# Patient Record
Sex: Female | Born: 1935 | Race: White | Hispanic: No | Smoking: Never smoker
Health system: Southern US, Community
[De-identification: ages and names within clinical notes are randomized; demographics above are authoritative.]

## PROBLEM LIST (undated history)

## (undated) DIAGNOSIS — I10 Essential (primary) hypertension: Secondary | ICD-10-CM

## (undated) DIAGNOSIS — Z950 Presence of cardiac pacemaker: Secondary | ICD-10-CM

## (undated) HISTORY — PX: BREAST LUMPECTOMY: SHX2

## (undated) HISTORY — PX: CHOLECYSTECTOMY: SHX55

## (undated) HISTORY — PX: ABDOMINAL HYSTERECTOMY: SHX81

## (undated) HISTORY — PX: APPENDECTOMY: SHX54

---

## 2014-08-14 ENCOUNTER — Emergency Department (HOSPITAL_COMMUNITY): Payer: Medicare Other

## 2014-08-14 ENCOUNTER — Emergency Department (HOSPITAL_COMMUNITY)
Admission: EM | Admit: 2014-08-14 | Discharge: 2014-08-14 | Disposition: A | Discharge status: Home / Self Care | Payer: Medicare Other | Attending: Emergency Medicine | Admitting: Emergency Medicine

## 2014-08-14 ENCOUNTER — Encounter (HOSPITAL_COMMUNITY): Payer: Self-pay

## 2014-08-14 DIAGNOSIS — M25561 Pain in right knee: Secondary | ICD-10-CM | POA: Insufficient documentation

## 2014-08-14 DIAGNOSIS — Z950 Presence of cardiac pacemaker: Secondary | ICD-10-CM | POA: Diagnosis not present

## 2014-08-14 DIAGNOSIS — T1490XA Injury, unspecified, initial encounter: Secondary | ICD-10-CM

## 2014-08-14 DIAGNOSIS — M1711 Unilateral primary osteoarthritis, right knee: Secondary | ICD-10-CM

## 2014-08-14 DIAGNOSIS — I10 Essential (primary) hypertension: Secondary | ICD-10-CM | POA: Diagnosis not present

## 2014-08-14 HISTORY — DX: Essential (primary) hypertension: I10

## 2014-08-14 HISTORY — DX: Presence of cardiac pacemaker: Z95.0

## 2014-08-14 MED ORDER — TRAMADOL HCL 50 MG PO TABS: 50.0000 mg | ORAL_TABLET | Freq: Four times a day (QID) | ORAL | Status: DC | PRN

## 2014-08-14 NOTE — ED Notes (Signed)
Pt fell 3 months ago and face planted.  Has had trouble with knee since then.  Sciatic pain, knee pain and has been trying meds.  Pt felt click in rt knee yesterday.  Pain increased and difficulty ambulating.

## 2014-08-14 NOTE — Discharge Instructions (Signed)
Wear your immobilizer as needed. Follow up with the orthopedic physician.  Tramadol tablets What is this medicine? TRAMADOL (TRA ma dole) is a pain reliever. It is used to treat moderate to severe pain in adults. This medicine may be used for other purposes; ask your health care provider or pharmacist if you have questions. COMMON BRAND NAME(S): Ultram What should I tell my health care provider before I take this medicine? They need to know if you have any of these conditions: -brain tumor -depression -drug abuse or addiction -head injury -if you frequently drink alcohol containing drinks -kidney disease or trouble passing urine -liver disease -lung disease, asthma, or breathing problems -seizures or epilepsy -suicidal thoughts, plans, or attempt; a previous suicide attempt by you or a family member -an unusual or allergic reaction to tramadol, codeine, other medicines, foods, dyes, or preservatives -pregnant or trying to get pregnant -breast-feeding How should I use this medicine? Take this medicine by mouth with a full glass of water. Follow the directions on the prescription label. If the medicine upsets your stomach, take it with food or milk. Do not take more medicine than you are told to take. Talk to your pediatrician regarding the use of this medicine in children. Special care may be needed. Overdosage: If you think you have taken too much of this medicine contact a poison control center or emergency room at once. NOTE: This medicine is only for you. Do not share this medicine with others. What if I miss a dose? If you miss a dose, take it as soon as you can. If it is almost time for your next dose, take only that dose. Do not take double or extra doses. What may interact with this medicine? Do not take this medicine with any of the following medications: -MAOIs like Carbex, Eldepryl, Marplan, Nardil, and Parnate This medicine may also interact with the following  medications: -alcohol or medicines that contain alcohol -antihistamines -benzodiazepines -bupropion -carbamazepine or oxcarbazepine -clozapine -cyclobenzaprine -digoxin -furazolidone -linezolid -medicines for depression, anxiety, or psychotic disturbances -medicines for migraine headache like almotriptan, eletriptan, frovatriptan, naratriptan, rizatriptan, sumatriptan, zolmitriptan -medicines for pain like pentazocine, buprenorphine, butorphanol, meperidine, nalbuphine, and propoxyphene -medicines for sleep -muscle relaxants -naltrexone -phenobarbital -phenothiazines like perphenazine, thioridazine, chlorpromazine, mesoridazine, fluphenazine, prochlorperazine, promazine, and trifluoperazine -procarbazine -warfarin This list may not describe all possible interactions. Give your health care provider a list of all the medicines, herbs, non-prescription drugs, or dietary supplements you use. Also tell them if you smoke, drink alcohol, or use illegal drugs. Some items may interact with your medicine. What should I watch for while using this medicine? Tell your doctor or health care professional if your pain does not go away, if it gets worse, or if you have new or a different type of pain. You may develop tolerance to the medicine. Tolerance means that you will need a higher dose of the medicine for pain relief. Tolerance is normal and is expected if you take this medicine for a long time. Do not suddenly stop taking your medicine because you may develop a severe reaction. Your body becomes used to the medicine. This does NOT mean you are addicted. Addiction is a behavior related to getting and using a drug for a non-medical reason. If you have pain, you have a medical reason to take pain medicine. Your doctor will tell you how much medicine to take. If your doctor wants you to stop the medicine, the dose will be slowly lowered over time to avoid any  side effects. You may get drowsy or dizzy. Do  not drive, use machinery, or do anything that needs mental alertness until you know how this medicine affects you. Do not stand or sit up quickly, especially if you are an older patient. This reduces the risk of dizzy or fainting spells. Alcohol can increase or decrease the effects of this medicine. Avoid alcoholic drinks. You may have constipation. Try to have a bowel movement at least every 2 to 3 days. If you do not have a bowel movement for 3 days, call your doctor or health care professional. Your mouth may get dry. Chewing sugarless gum or sucking hard candy, and drinking plenty of water may help. Contact your doctor if the problem does not go away or is severe. What side effects may I notice from receiving this medicine? Side effects that you should report to your doctor or health care professional as soon as possible: -allergic reactions like skin rash, itching or hives, swelling of the face, lips, or tongue -breathing difficulties, wheezing -confusion -itching -light headedness or fainting spells -redness, blistering, peeling or loosening of the skin, including inside the mouth -seizures Side effects that usually do not require medical attention (report to your doctor or health care professional if they continue or are bothersome): -constipation -dizziness -drowsiness -headache -nausea, vomiting This list may not describe all possible side effects. Call your doctor for medical advice about side effects. You may report side effects to FDA at 1-800-FDA-1088. Where should I keep my medicine? Keep out of the reach of children. Store at room temperature between 15 and 30 degrees C (59 and 86 degrees F). Keep container tightly closed. Throw away any unused medicine after the expiration date. NOTE: This sheet is a summary. It may not cover all possible information. If you have questions about this medicine, talk to your doctor, pharmacist, or health care provider.  2015, [employer]. (2010-05-18 11:55:44)

## 2014-08-14 NOTE — ED Provider Notes (Addendum)
CSN: [id number]     Arrival date & time 08/14/14  1032 History   First MD Initiated Contact with Patient 08/14/14 1228     Chief Complaint  Patient presents with  . Fall  . Knee Pain     (Consider location/radiation/quality/duration/timing/severity/associated sxs/prior Treatment) Patient is a 80 y.o. female presenting with fall and knee pain. The history is provided by the patient.  Fall  Knee Pain She had fallen about 3 months ago and injured her right knee. She saw her orthopedic doctor in [place] who x-rayed her knee and told her she had arthritis. The knee had been improving until last night when she felt something pop in the knee and she has had severe pain in the knee since then. Pain is 0/10 if she is at rest but goes to 8/10 if she tries to move it or stand on it. She denies pain in her hip or back. She has been ambulating with a cane.  Past Medical History  Diagnosis Date  . Pacemaker   . Hypertension    Past Surgical History  Procedure Laterality Date  . Cholecystectomy    . Appendectomy    . Breast lumpectomy    . Abdominal hysterectomy     History reviewed. No pertinent family history. History  Substance Use Topics  . Smoking status: Never Smoker   . Smokeless tobacco: Not on file  . Alcohol Use: No   OB History    No data available     Review of Systems  All other systems reviewed and are negative.     Allergies  Sulfa antibiotics  Home Medications   Prior to Admission medications   Not on File   BP 115/46 mmHg  Pulse 81  Temp(Src) 97.5 F (36.4 C) (Oral)  Resp 18  SpO2 97% Physical Exam  Nursing note and vitals reviewed.  80 year old female, resting comfortably and in no acute distress. Vital signs are normal. Oxygen saturation is 97%, which is normal. Head is normocephalic and atraumatic. PERRLA, EOMI. Oropharynx is clear. Neck is nontender and supple without adenopathy or JVD. Back is nontender and there is no CVA tenderness. Lungs  are clear without rales, wheezes, or rhonchi. Chest is nontender. Heart has regular rate and rhythm without murmur. Abdomen is soft, flat, nontender without masses or hepatosplenomegaly and peristalsis is normoactive. Extremities: Right knee is moderately swollen with a small effusion present. There is pain with passive flexion of the knee. There is no tenderness to palpation the right hip and there is full range of motion of the right hip without any discomfort whatsoever. 1+ pitting edema is present bilaterally. Skin is warm and dry without rash. Neurologic: Mental status is normal, cranial nerves are intact, there are no motor or sensory deficits.  ED Course  Procedures (including critical care time)  Imaging Review Dg Knee Complete 4 Views Right  08/14/2014   CLINICAL DATA:  Fall 3 months ago with pain in the knee.  EXAM: RIGHT KNEE - COMPLETE 4+ VIEW  COMPARISON:  None.  FINDINGS: The knee is located. There is moderate joint space narrowing of the medial compartment. Osteophytes are seen in the lateral and patellofemoral compartments. Lateral compartment joint space is preserved.  On two views, a probable 7 mm intra-articular loose body is seen.  No acute fracture is seen.  Negative for joint effusion.  IMPRESSION: 1. No definite acute bony abnormality. 2. Probable 7 mm loose body in the deep joint. 3. 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   Electronically Signed   By: Curlene Dolphin M.D.   On: 08/14/2014 12:13   Images viewed by me.  MDM   Final diagnoses:  Pain in right knee  Osteoarthritis of right knee, unspecified osteoarthritis type    Right knee pain of uncertain cause. X-ray shows no evidence of fracture. She may well have a meniscus injury. Patient and family are requesting referral to an orthopedist in [facility] referred to on-call orthopedics per G is given a knee immobilizer for comfort and a prescription is given for  tramadol for pain. Of note, she does have a pacemaker which is programmed to treat tachycardia and this will probably prevent her from having MRI scan done. She is also anticoagulated on rivaroxaban.    Delora Fuel, MD 30/07/62 2633  Cordero Surette, MD 35/45/62 [id number]

## 2014-10-23 DIAGNOSIS — E119 Type 2 diabetes mellitus without complications: Secondary | ICD-10-CM | POA: Diagnosis not present

## 2014-10-23 DIAGNOSIS — I10 Essential (primary) hypertension: Secondary | ICD-10-CM | POA: Diagnosis not present

## 2014-10-26 DIAGNOSIS — R7301 Impaired fasting glucose: Secondary | ICD-10-CM | POA: Diagnosis not present

## 2014-10-26 DIAGNOSIS — I10 Essential (primary) hypertension: Secondary | ICD-10-CM | POA: Diagnosis not present

## 2014-10-26 DIAGNOSIS — G4700 Insomnia, unspecified: Secondary | ICD-10-CM | POA: Diagnosis not present

## 2014-11-16 DIAGNOSIS — G4700 Insomnia, unspecified: Secondary | ICD-10-CM | POA: Diagnosis not present

## 2014-11-16 DIAGNOSIS — I10 Essential (primary) hypertension: Secondary | ICD-10-CM | POA: Diagnosis not present

## 2014-11-24 DIAGNOSIS — I10 Essential (primary) hypertension: Secondary | ICD-10-CM | POA: Diagnosis not present

## 2014-11-24 DIAGNOSIS — I4891 Unspecified atrial fibrillation: Secondary | ICD-10-CM | POA: Diagnosis not present

## 2014-11-24 DIAGNOSIS — I442 Atrioventricular block, complete: Secondary | ICD-10-CM | POA: Diagnosis not present

## 2014-11-30 DIAGNOSIS — G4700 Insomnia, unspecified: Secondary | ICD-10-CM | POA: Diagnosis not present

## 2014-11-30 DIAGNOSIS — I10 Essential (primary) hypertension: Secondary | ICD-10-CM | POA: Diagnosis not present

## 2014-12-16 DIAGNOSIS — M25561 Pain in right knee: Secondary | ICD-10-CM | POA: Diagnosis not present

## 2015-02-15 IMAGING — CR DG KNEE COMPLETE 4+V*R*
4 series · 4 of 4 positions shown · non-contrast
Comparison: None.

CLINICAL DATA: Fall 3 months ago with pain in the knee.

EXAM:
RIGHT KNEE - COMPLETE 4+ VIEW

[t knee ap right]
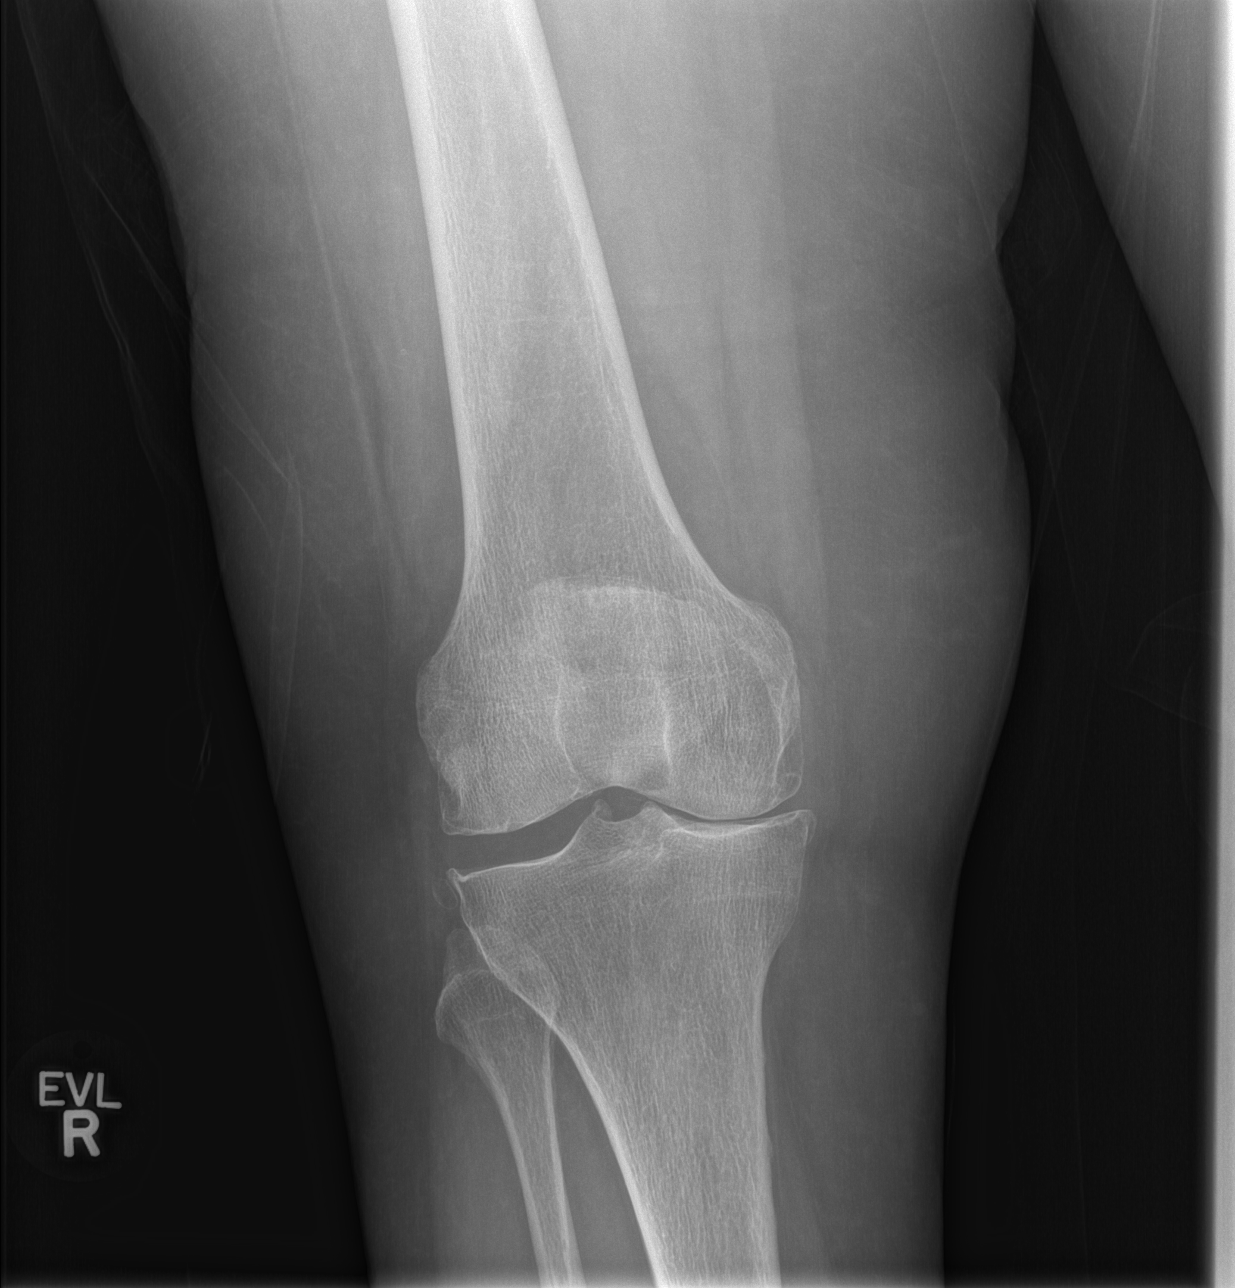

[t knee obl right (1 of 2)]
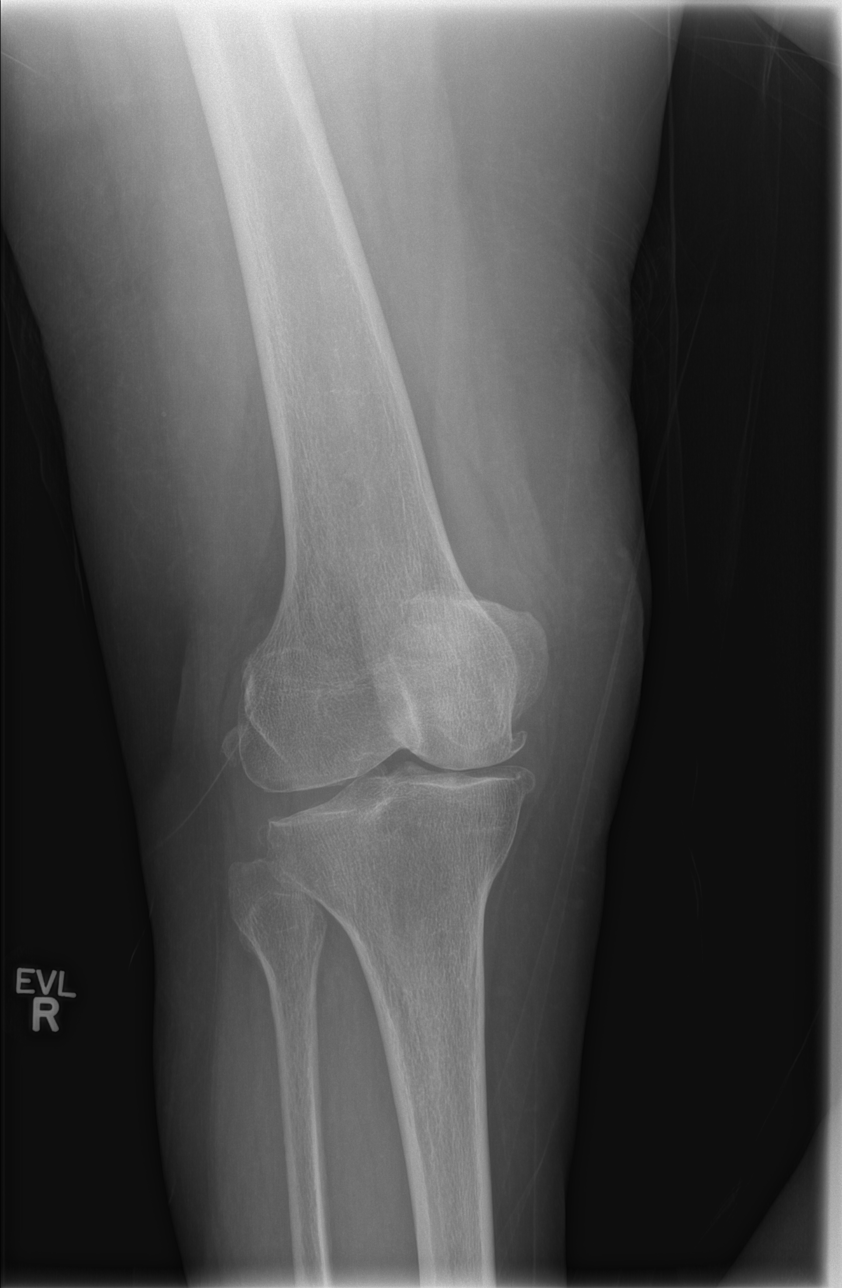

[t knee obl right (2 of 2)]
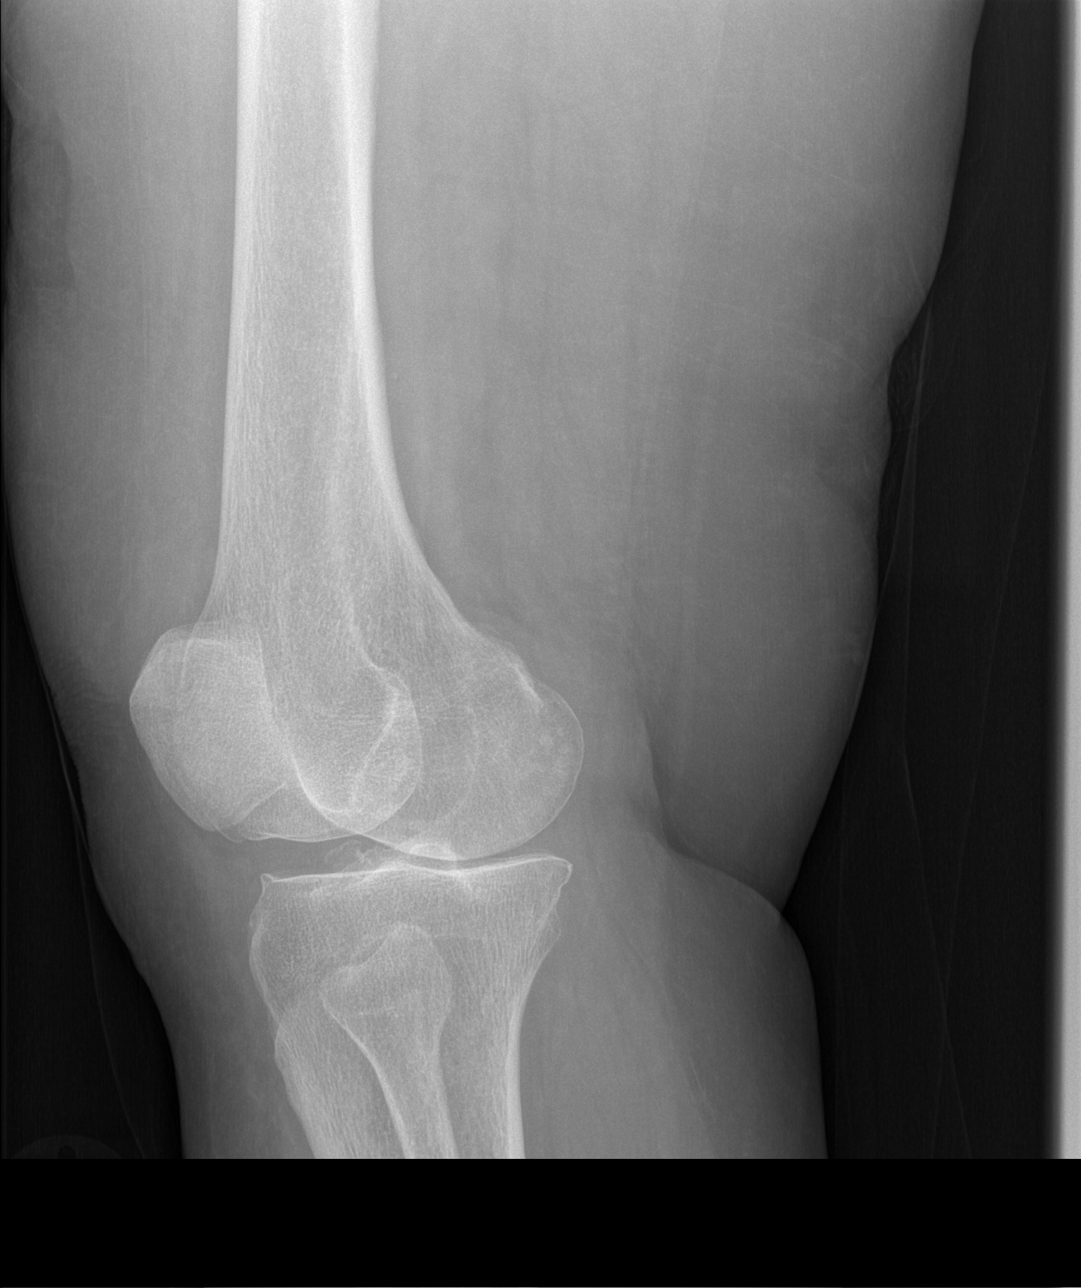

[t knee lat right]
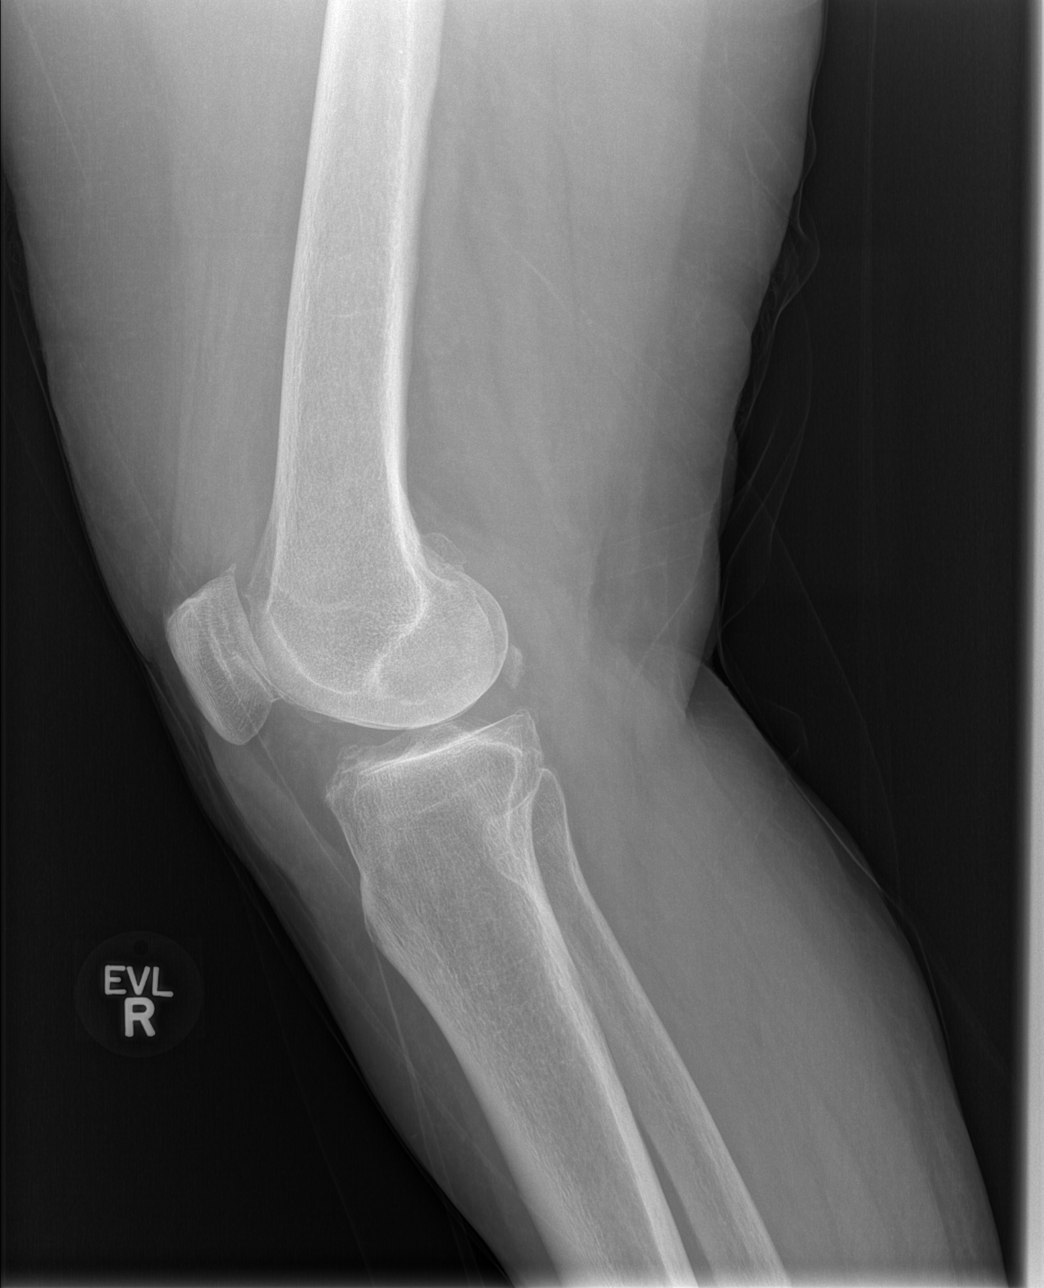

[4 of 4 positions shown; findings below may reference images not displayed]

FINDINGS: The knee is located. There is moderate joint space narrowing of the
medial compartment. Osteophytes are seen in the lateral and
patellofemoral compartments. Lateral compartment joint space is
preserved.

On two views, a probable 7 mm intra-articular loose body is seen.

No acute fracture is seen.

Negative for joint effusion.
IMPRESSION: 1. No definite acute bony abnormality.
2. Probable 7 mm loose body in the deep joint.
3. Tricompartmental osteoarthritis, most significant in the medial
compartment.

## 2015-02-26 DIAGNOSIS — I442 Atrioventricular block, complete: Secondary | ICD-10-CM | POA: Diagnosis not present

## 2015-02-26 DIAGNOSIS — I4891 Unspecified atrial fibrillation: Secondary | ICD-10-CM | POA: Diagnosis not present

## 2015-04-16 DIAGNOSIS — E119 Type 2 diabetes mellitus without complications: Secondary | ICD-10-CM | POA: Diagnosis not present

## 2015-04-16 DIAGNOSIS — E559 Vitamin D deficiency, unspecified: Secondary | ICD-10-CM | POA: Diagnosis not present

## 2015-04-16 DIAGNOSIS — E780 Pure hypercholesterolemia: Secondary | ICD-10-CM | POA: Diagnosis not present

## 2015-04-16 DIAGNOSIS — E039 Hypothyroidism, unspecified: Secondary | ICD-10-CM | POA: Diagnosis not present

## 2015-04-16 DIAGNOSIS — I10 Essential (primary) hypertension: Secondary | ICD-10-CM | POA: Diagnosis not present

## 2015-04-26 DIAGNOSIS — Z0000 Encounter for general adult medical examination without abnormal findings: Secondary | ICD-10-CM | POA: Diagnosis not present

## 2015-04-26 DIAGNOSIS — I4891 Unspecified atrial fibrillation: Secondary | ICD-10-CM | POA: Diagnosis not present

## 2015-04-26 DIAGNOSIS — Z23 Encounter for immunization: Secondary | ICD-10-CM | POA: Diagnosis not present

## 2015-04-26 DIAGNOSIS — I10 Essential (primary) hypertension: Secondary | ICD-10-CM | POA: Diagnosis not present

## 2015-04-26 DIAGNOSIS — E119 Type 2 diabetes mellitus without complications: Secondary | ICD-10-CM | POA: Diagnosis not present

## 2015-04-26 DIAGNOSIS — G4700 Insomnia, unspecified: Secondary | ICD-10-CM | POA: Diagnosis not present

## 2015-06-08 DIAGNOSIS — I442 Atrioventricular block, complete: Secondary | ICD-10-CM | POA: Diagnosis not present

## 2015-06-08 DIAGNOSIS — I495 Sick sinus syndrome: Secondary | ICD-10-CM | POA: Diagnosis not present

## 2015-06-08 DIAGNOSIS — I4891 Unspecified atrial fibrillation: Secondary | ICD-10-CM | POA: Diagnosis not present

## 2015-07-14 DIAGNOSIS — H47323 Drusen of optic disc, bilateral: Secondary | ICD-10-CM | POA: Diagnosis not present

## 2015-07-22 DIAGNOSIS — L219 Seborrheic dermatitis, unspecified: Secondary | ICD-10-CM | POA: Diagnosis not present

## 2015-07-22 DIAGNOSIS — L821 Other seborrheic keratosis: Secondary | ICD-10-CM | POA: Diagnosis not present

## 2015-07-22 DIAGNOSIS — I781 Nevus, non-neoplastic: Secondary | ICD-10-CM | POA: Diagnosis not present

## 2015-08-05 DIAGNOSIS — Z23 Encounter for immunization: Secondary | ICD-10-CM | POA: Diagnosis not present

## 2015-09-09 DIAGNOSIS — I495 Sick sinus syndrome: Secondary | ICD-10-CM | POA: Diagnosis not present

## 2015-09-09 DIAGNOSIS — I442 Atrioventricular block, complete: Secondary | ICD-10-CM | POA: Diagnosis not present

## 2015-09-09 DIAGNOSIS — I4891 Unspecified atrial fibrillation: Secondary | ICD-10-CM | POA: Diagnosis not present

## 2015-10-29 DIAGNOSIS — E119 Type 2 diabetes mellitus without complications: Secondary | ICD-10-CM | POA: Diagnosis not present

## 2015-10-29 DIAGNOSIS — Z136 Encounter for screening for cardiovascular disorders: Secondary | ICD-10-CM | POA: Diagnosis not present

## 2015-10-29 DIAGNOSIS — I10 Essential (primary) hypertension: Secondary | ICD-10-CM | POA: Diagnosis not present

## 2015-11-01 DIAGNOSIS — R202 Paresthesia of skin: Secondary | ICD-10-CM | POA: Diagnosis not present

## 2015-11-01 DIAGNOSIS — G4700 Insomnia, unspecified: Secondary | ICD-10-CM | POA: Diagnosis not present

## 2015-11-01 DIAGNOSIS — E119 Type 2 diabetes mellitus without complications: Secondary | ICD-10-CM | POA: Diagnosis not present

## 2015-11-01 DIAGNOSIS — I10 Essential (primary) hypertension: Secondary | ICD-10-CM | POA: Diagnosis not present

## 2015-12-14 DIAGNOSIS — I442 Atrioventricular block, complete: Secondary | ICD-10-CM | POA: Diagnosis not present

## 2015-12-14 DIAGNOSIS — I495 Sick sinus syndrome: Secondary | ICD-10-CM | POA: Diagnosis not present

## 2015-12-14 DIAGNOSIS — I4891 Unspecified atrial fibrillation: Secondary | ICD-10-CM | POA: Diagnosis not present

## 2015-12-27 DIAGNOSIS — R55 Syncope and collapse: Secondary | ICD-10-CM | POA: Diagnosis not present

## 2016-02-01 DIAGNOSIS — M1711 Unilateral primary osteoarthritis, right knee: Secondary | ICD-10-CM | POA: Diagnosis not present

## 2016-03-22 DIAGNOSIS — I495 Sick sinus syndrome: Secondary | ICD-10-CM | POA: Diagnosis not present

## 2016-03-22 DIAGNOSIS — I442 Atrioventricular block, complete: Secondary | ICD-10-CM | POA: Diagnosis not present

## 2016-03-22 DIAGNOSIS — I4891 Unspecified atrial fibrillation: Secondary | ICD-10-CM | POA: Diagnosis not present

## 2016-04-26 ENCOUNTER — Telehealth: Payer: Self-pay | Admitting: Cardiology

## 2016-05-01 ENCOUNTER — Telehealth: Payer: Self-pay | Admitting: Cardiology

## 2016-05-01 NOTE — Telephone Encounter (Signed)
Records rec from [place] in chart prep room.

## 2016-05-02 ENCOUNTER — Encounter: Payer: Self-pay | Admitting: Cardiology

## 2016-05-03 DIAGNOSIS — Z950 Presence of cardiac pacemaker: Secondary | ICD-10-CM | POA: Insufficient documentation

## 2016-05-03 DIAGNOSIS — I10 Essential (primary) hypertension: Secondary | ICD-10-CM | POA: Insufficient documentation

## 2016-05-04 ENCOUNTER — Encounter (INDEPENDENT_AMBULATORY_CARE_PROVIDER_SITE_OTHER): Payer: Self-pay

## 2016-05-04 ENCOUNTER — Encounter: Payer: Self-pay | Admitting: Cardiology

## 2016-05-04 ENCOUNTER — Ambulatory Visit (INDEPENDENT_AMBULATORY_CARE_PROVIDER_SITE_OTHER): Payer: Medicare Other | Admitting: Cardiology

## 2016-05-04 VITALS — BP 144/86 | HR 78 | Ht 63.0 in | Wt 173.8 lb

## 2016-05-04 DIAGNOSIS — I442 Atrioventricular block, complete: Secondary | ICD-10-CM

## 2016-05-04 DIAGNOSIS — I482 Chronic atrial fibrillation: Secondary | ICD-10-CM

## 2016-05-04 DIAGNOSIS — I4821 Permanent atrial fibrillation: Secondary | ICD-10-CM

## 2016-05-04 LAB — CUP PACEART INCLINIC DEVICE CHECK
Date Time Interrogation Session: 20170817170608
Lead Channel Impedance Value: 312 Ohm
Lead Channel Impedance Value: 507 Ohm
Lead Channel Sensing Intrinsic Amplitude: 2.5 mV
Lead Channel Setting Pacing Pulse Width: 0.4 ms
Lead Channel Setting Sensing Sensitivity: 2.5 mV
MDC IDC MSMT LEADCHNL RV PACING THRESHOLD AMPLITUDE: 0.6 V
MDC IDC MSMT LEADCHNL RV PACING THRESHOLD PULSEWIDTH: 0.4 ms
MDC IDC SET LEADCHNL RV PACING AMPLITUDE: 2.5 V
Pulse Gen Serial Number: 66295010

## 2016-05-04 NOTE — Progress Notes (Signed)
Electrophysiology Office Note   Date:  05/07/2016   ID:  Penny Sanders, DOB 1927/03/23, MRN [id number]  PCP:  Pcp Not In System  Primary Electrophysiologist:  Penny Vezina Meredith Leeds, MD    Chief Complaint  Patient presents with  . New Patient (Initial Visit)  . Dizziness     History of Present Illness: Penny Sanders is a 82 y.o. female who presents today for electrophysiology evaluation.    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 She has persistent atrial fibrillation and has been programmed to VVIR. She currently takes rivaroxaban for her atrial fibrillation.  She had an echo done in 2013 that showed an EF greater than 65% and mild mitral regurgitation.    Today, she denies symptoms of palpitations, chest pain, shortness of breath, orthopnea, PND, lower extremity edema, claudication, presyncope, bleeding, or neurologic sequela. The patient is tolerating medications without difficulties. She says that she has been having episodes of dizziness when changing positions.  She was previously told by her PA to change positions slowly as she likely is orthostatic.  She states that his has helped quite a bit with her episodes of dizziness.  She also states that she has been taking her Xarelto every other day.  She is doing this as she is scared that she Penny Sanders have life-threatening bleeding.    Past Medical History:  Diagnosis Date  . Hypertension   . Pacemaker    Past Surgical History:  Procedure Laterality Date  . ABDOMINAL HYSTERECTOMY    . APPENDECTOMY    . BREAST LUMPECTOMY    . CHOLECYSTECTOMY       Current Outpatient Prescriptions  Medication Sig Dispense Refill  . acetaminophen (TYLENOL) 500 MG tablet Take 500 mg by mouth every 6 (six) hours as needed for mild pain.    Marland Kitchen amLODipine-benazepril (LOTREL) 5-10 MG per capsule Take 1 capsule by mouth daily.   0  . CALCIUM PO Take 1 tablet by mouth  daily.    . cholecalciferol (VITAMIN D) 1000 UNITS tablet Take 1,000 Units by mouth 2 (two) times daily.    . hydroxypropyl methylcellulose / hypromellose (ISOPTO TEARS / GONIOVISC) 2.5 % ophthalmic solution Place 1 drop into both eyes as needed for dry eyes.    . multivitamin-lutein (OCUVITE-LUTEIN) CAPS capsule Take 1 capsule by mouth daily.    . potassium chloride (K-DUR,KLOR-CON) 10 MEQ tablet Take 10 mEq by mouth 2 (two) times daily.   0  . sodium chloride (OCEAN) 0.65 % SOLN nasal spray Place 1 spray into both nostrils as needed for congestion.    Alveda Reasons 20 MG TABS tablet Take 20 mg by mouth daily with supper.   0   No current facility-administered medications for this visit.     Allergies:   Sulfa antibiotics   Social History:  The patient  reports that she has never smoked. She has never used smokeless tobacco. She reports that she does not drink alcohol.   Family History:  [name]   family history includes Anemia in her mother; Bladder Cancer in her father; Breast cancer in her sister; Cancer in her brother; Hypertension in her mother.    ROS:  Please see the history of present illness.   Otherwise, review of systems is positive for dizziness, passing out.   All other systems are reviewed and negative.    PHYSICAL EXAM: VS:  BP (!) 144/86   Pulse 78  Ht 5\' 3"  (1.6 m)   Wt 173 lb 12.8 oz (78.8 kg)   BMI 30.79 kg/m  , BMI Body mass index is 30.79 kg/m. GEN: Well nourished, well developed, in no acute distress  HEENT: normal  Neck: no JVD, carotid bruits, or masses Cardiac: RRR; no murmurs, rubs, or gallops,no edema  Respiratory:  clear to auscultation bilaterally, normal work of breathing GI: soft, nontender, nondistended, + BS MS: no deformity or atrophy  Skin: warm and dry,  device pocket is well healed Neuro:  Strength and sensation are intact Psych: euthymic mood, full affect  EKG:  EKG is ordered today. The ekg ordered today shows atrial fibrillation, V  paced   Device interrogation is reviewed by me today in detail.  See PaceArt for details.   Recent Labs: No results found for requested labs within last 8760 hours.    Lipid Panel  No results found for: CHOL, TRIG, HDL, CHOLHDL, VLDL, LDLCALC, LDLDIRECT   Wt Readings from Last 3 Encounters:  05/04/16 173 lb 12.8 oz (78.8 kg)      Other studies Reviewed: Additional studies/ records that were reviewed today include: cardiology notes   ASSESSMENT AND PLAN:  1.  Persistent atrial fibrillation:  Is in complete heart block with her atrial fibrillation. We did discuss her anticoagulation. She takes, currently, rivaroxaban. She does take this every other day as she is worried about bleeding complications. We discussed her risk of stroke being 4.8% per yea daily and the importance of anticoagulation. I told her that her risk of stroke is likely higher than her risk of significant bleeding as she has had normal colonoscopies in the past. She has agreed to take her anticoagulation  This patients CHA2DS2-VASc Score and unadjusted Ischemic Stroke Rate (% per year) is equal to 4.8 % stroke rate/year from a score of 4  Above score calculated as 1 point each if present [CHF, HTN, DM, Vascular=MI/PAD/Aortic Plaque, Age if 65-74, or Female] Above score calculated as 2 points each if present [Age > 75, or Stroke/TIA/TE]   2.  Complete AV block:  Has dual-chamber pacemaker, but is in persistent atrial fibrillation. Sent to VVIR 60. No changes were made today.    Current medicines are reviewed at length with the patient today.   The patient does not have concerns regarding her medicines.  The following changes were made today:  none  Labs/ tests ordered today include:  Orders Placed This Encounter  Procedures  . Implantable device check  . EKG 12-Lead     Disposition:   FU with Penny Sanders 6 months  Signed, Penny Claybrook Meredith Leeds, MD  05/07/2016 7:12 PM     [address] [address] [phone] (office) [phone] (fax)

## 2016-05-04 NOTE — Patient Instructions (Signed)
Medication Instructions:  Your physician recommends that you continue on your current medications as directed. Please refer to the Current Medication list given to you today.  Labwork: None ordered   Testing/Procedures: None ordered   Follow-Up: Your physician wants you to follow-up in: 6 months with Dr Cora Daniels will receive a reminder letter in the mail two months in advance. If you don't receive a letter, please call our office to schedule the follow-up appointment.  Remote monitoring is used to monitor your Pacemaker or ICD from home. This monitoring reduces the number of office visits required to check your device to one time per year. It allows [place] to keep an eye on the functioning of your device to ensure it is working properly. You are scheduled for a device check from home on 08/03/16. You may send your transmission at any time that day. If you have a wireless device, the transmission will be sent automatically. After your physician reviews your transmission, you will receive a postcard with your next transmission date.     Any Other Special Instructions Will Be Listed Below (If Applicable).     If you need a refill on your cardiac medications before your next appointment, please call your pharmacy.

## 2016-05-15 ENCOUNTER — Other Ambulatory Visit: Payer: Self-pay | Admitting: Cardiology

## 2016-08-03 ENCOUNTER — Ambulatory Visit (INDEPENDENT_AMBULATORY_CARE_PROVIDER_SITE_OTHER): Payer: Medicare Other | Admitting: *Deleted

## 2016-08-03 DIAGNOSIS — I442 Atrioventricular block, complete: Secondary | ICD-10-CM | POA: Diagnosis not present

## 2016-08-04 NOTE — Progress Notes (Signed)
Remote pacemaker transmission.   

## 2016-08-09 ENCOUNTER — Encounter: Payer: Self-pay | Admitting: Cardiology

## 2016-08-29 DIAGNOSIS — Z23 Encounter for immunization: Secondary | ICD-10-CM | POA: Diagnosis not present

## 2016-10-12 LAB — CUP PACEART REMOTE DEVICE CHECK
Brady Statistic RV Percent Paced: 100 %
Date Time Interrogation Session: 20180125141739
Implantable Lead Implant Date: 20130903
Implantable Lead Location: 753860
Implantable Lead Serial Number: 1111
Implantable Pulse Generator Implant Date: 20130903
Lead Channel Impedance Value: 527 Ohm
Lead Channel Sensing Intrinsic Amplitude: 16.2 mV
Lead Channel Setting Pacing Amplitude: 2.5 V
Lead Channel Setting Pacing Pulse Width: 0.4 ms
Lead Channel Setting Sensing Sensitivity: 2.5 mV
MDC IDC LEAD IMPLANT DT: 20130903
MDC IDC LEAD LOCATION: 753859
MDC IDC PG SERIAL: 66295010

## 2016-10-19 DIAGNOSIS — Z950 Presence of cardiac pacemaker: Secondary | ICD-10-CM | POA: Diagnosis not present

## 2016-10-19 DIAGNOSIS — I10 Essential (primary) hypertension: Secondary | ICD-10-CM | POA: Diagnosis not present

## 2016-10-19 DIAGNOSIS — M150 Primary generalized (osteo)arthritis: Secondary | ICD-10-CM | POA: Diagnosis not present

## 2016-10-19 DIAGNOSIS — F5101 Primary insomnia: Secondary | ICD-10-CM | POA: Diagnosis not present

## 2016-10-19 DIAGNOSIS — Z0000 Encounter for general adult medical examination without abnormal findings: Secondary | ICD-10-CM | POA: Diagnosis not present

## 2016-11-02 ENCOUNTER — Encounter: Payer: Self-pay | Admitting: Cardiology

## 2016-11-02 ENCOUNTER — Ambulatory Visit (INDEPENDENT_AMBULATORY_CARE_PROVIDER_SITE_OTHER): Payer: Medicare Other | Admitting: Cardiology

## 2016-11-02 VITALS — BP 140/72 | HR 60 | Ht 64.0 in | Wt 176.6 lb

## 2016-11-02 DIAGNOSIS — Z950 Presence of cardiac pacemaker: Secondary | ICD-10-CM

## 2016-11-02 DIAGNOSIS — I442 Atrioventricular block, complete: Secondary | ICD-10-CM

## 2016-11-02 DIAGNOSIS — I4821 Permanent atrial fibrillation: Secondary | ICD-10-CM

## 2016-11-02 DIAGNOSIS — I482 Chronic atrial fibrillation: Secondary | ICD-10-CM

## 2016-11-02 NOTE — Patient Instructions (Signed)
Medication Instructions: Your physician recommends that you continue on your current medications as directed. Please refer to the Current Medication list given to you today.   Labwork: None Ordered  Procedures/Testing: None ordered  Follow-Up: Remote monitoring is used to monitor your Pacemaker of ICD from home. This monitoring reduces the number of office visits required to check your device to one time per year. It allows [place] to keep an eye on the functioning of your device to ensure it is working properly. You are scheduled for a device check from home on 02/01/17. You may send your transmission at any time that day. If you have a wireless device, the transmission will be sent automatically. After your physician reviews your transmission, you will receive a postcard with your next transmission date.   Your physician wants you to follow-up in: 1 year with Dr. Curt Bears. You will receive a reminder letter in the mail two months in advance. If you don't receive a letter, please call our office to schedule the follow-up appointment.    Any Additional Special Instructions Will Be Listed Below (If Applicable).     If you need a refill on your cardiac medications before your next appointment, please call your pharmacy.

## 2016-11-02 NOTE — Progress Notes (Signed)
Electrophysiology Office Note   Date:  11/02/2016   ID:  Penny Sanders, DOB 10-05-1926, MRN [id number]  PCP:  Pcp Not In System  Primary Electrophysiologist:  Tyris Eliot Meredith Leeds, MD    Chief Complaint  Patient presents with  . Pacemaker Check    Complete heart block/Perm Afib     History of Present Illness: Penny Sanders is a 83 y.o. female who presents today for electrophysiology evaluation.    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 She has persistent atrial fibrillation and has been programmed to VVIR. She currently takes rivaroxaban for her atrial fibrillation.  She had an echo done in 2013 that showed an EF greater than 65% and mild mitral regurgitation.    Today, she denies symptoms of palpitations, chest pain, shortness of breath, orthopnea, PND, lower extremity edema, claudication, presyncope, bleeding, or neurologic sequela. The patient is tolerating medications without difficulties. She has done well since last being seen. Not having any chest pain or shortness of breath.   Past Medical History:  Diagnosis Date  . Hypertension   . Pacemaker    Past Surgical History:  Procedure Laterality Date  . ABDOMINAL HYSTERECTOMY    . APPENDECTOMY    . BREAST LUMPECTOMY    . CHOLECYSTECTOMY       Current Outpatient Prescriptions  Medication Sig Dispense Refill  . acetaminophen (TYLENOL) 500 MG tablet Take 500 mg by mouth every 6 (six) hours as needed for mild pain.    Marland Kitchen amLODipine-benazepril (LOTREL) 5-10 MG per capsule Take 1 capsule by mouth daily.   0  . CALCIUM PO Take 1 tablet by mouth daily.    . cholecalciferol (VITAMIN D) 1000 UNITS tablet Take 1,000 Units by mouth 2 (two) times daily.    . hydrochlorothiazide (HYDRODIURIL) 12.5 MG tablet Take 12.5 mg by mouth daily.  0  . hydroxypropyl methylcellulose / hypromellose (ISOPTO TEARS / GONIOVISC) 2.5 % ophthalmic solution Place 1 drop  into both eyes as needed for dry eyes.    . multivitamin-lutein (OCUVITE-LUTEIN) CAPS capsule Take 1 capsule by mouth daily.    . potassium chloride (K-DUR,KLOR-CON) 10 MEQ tablet Take 10 mEq by mouth 2 (two) times daily.   0  . sodium chloride (OCEAN) 0.65 % SOLN nasal spray Place 1 spray into both nostrils as needed for congestion.    Penny Sanders 20 MG TABS tablet TAKE 1 TABLET BY MOUTH ONCE DAILY WITH EVENING MEAL 30 tablet 11   No current facility-administered medications for this visit.     Allergies:   Sulfa antibiotics   Social History:  The patient  reports that she has never smoked. She has never used smokeless tobacco. She reports that she does not drink alcohol.   Family History:  [name]   family history includes Anemia in her mother; Bladder Cancer in her father; Breast cancer in her sister; Cancer in her brother; Hypertension in her mother.    ROS:  Please see the history of present illness.   Otherwise, review of systems is positive for none.   All other systems are reviewed and negative.    PHYSICAL EXAM: VS:  BP 140/72   Pulse 60   Ht 5\' 4"  (1.626 m)   Wt 176 lb 9.6 oz (80.1 kg)   BMI 30.31 kg/m  , BMI Body mass index is 30.31 kg/m. GEN: Well nourished, well developed, in no acute distress  HEENT: normal  Neck: no JVD, carotid bruits, or masses Cardiac: RRR; no murmurs, rubs, or gallops,no edema  Respiratory:  clear to auscultation bilaterally, normal work of breathing GI: soft, nontender, nondistended, + BS MS: no deformity or atrophy  Skin: warm and dry,  device pocket is well healed Neuro:  Strength and sensation are intact Psych: euthymic mood, full affect  EKG:  EKG is not ordered today. Personal review of the EKG ordered 05/04/16 shows atrial fibrillation, V paced   Device interrogation is reviewed by me today in detail.  See PaceArt for details.   Recent Labs: No results found for requested labs within last 8760 hours.    Lipid Panel  No  results found for: CHOL, TRIG, HDL, CHOLHDL, VLDL, LDLCALC, LDLDIRECT   Wt Readings from Last 3 Encounters:  11/02/16 176 lb 9.6 oz (80.1 kg)  05/04/16 173 lb 12.8 oz (78.8 kg)      Other studies Reviewed: Additional studies/ records that were reviewed today include: cardiology notes   ASSESSMENT AND PLAN:  1.  Persistent atrial fibrillation:  Is in complete heart block with her atrial fibrillation. He currently takes Xarelto for anticoagulation not having any issues. We'll continue current management.  This patients CHA2DS2-VASc Score and unadjusted Ischemic Stroke Rate (% per year) is equal to 4.8 % stroke rate/year from a score of 4  Above score calculated as 1 point each if present [CHF, HTN, DM, Vascular=MI/PAD/Aortic Plaque, Age if 65-74, or Female] Above score calculated as 2 points each if present [Age > 75, or Stroke/TIA/TE]   2.  Complete AV block:  Has dual-chamber pacemaker, but is in persistent atrial fibrillation. Set to VVIR 60. She is tolerating a heart block without any issues.    Current medicines are reviewed at length with the patient today.   The patient does not have concerns regarding her medicines.  The following changes were made today:  none  Labs/ tests ordered today include:  No orders of the defined types were placed in this encounter.    Disposition:   FU with [name] 12 months  Signed, Jermey Closs Meredith Leeds, MD  11/02/2016 3:14 PM     [place] [address] [phone] (office) [phone] (fax)

## 2016-11-03 ENCOUNTER — Encounter: Payer: Medicare Other | Admitting: Cardiology

## 2016-11-13 DIAGNOSIS — R05 Cough: Secondary | ICD-10-CM | POA: Diagnosis not present

## 2016-11-13 DIAGNOSIS — J302 Other seasonal allergic rhinitis: Secondary | ICD-10-CM | POA: Diagnosis not present

## 2016-11-13 DIAGNOSIS — I10 Essential (primary) hypertension: Secondary | ICD-10-CM | POA: Diagnosis not present

## 2016-11-20 LAB — CUP PACEART INCLINIC DEVICE CHECK
Date Time Interrogation Session: 20180305105926
Implantable Lead Implant Date: 20130903
Implantable Lead Location: 753860
Implantable Lead Model: 4456
MDC IDC LEAD IMPLANT DT: 20130903
MDC IDC LEAD LOCATION: 753859
MDC IDC LEAD SERIAL: 1111
MDC IDC PG IMPLANT DT: 20130903
MDC IDC PG SERIAL: 66295010

## 2017-01-25 DIAGNOSIS — Z961 Presence of intraocular lens: Secondary | ICD-10-CM | POA: Diagnosis not present

## 2017-01-25 DIAGNOSIS — H35363 Drusen (degenerative) of macula, bilateral: Secondary | ICD-10-CM | POA: Diagnosis not present

## 2017-01-25 DIAGNOSIS — H524 Presbyopia: Secondary | ICD-10-CM | POA: Diagnosis not present

## 2017-02-01 ENCOUNTER — Ambulatory Visit (INDEPENDENT_AMBULATORY_CARE_PROVIDER_SITE_OTHER): Payer: Medicare Other | Admitting: *Deleted

## 2017-02-01 DIAGNOSIS — I442 Atrioventricular block, complete: Secondary | ICD-10-CM

## 2017-02-01 LAB — CUP PACEART REMOTE DEVICE CHECK
Date Time Interrogation Session: 20180517140728
Implantable Lead Implant Date: 20130903
Implantable Lead Location: 753859
Implantable Lead Model: 4456
Implantable Lead Serial Number: 1111
Lead Channel Impedance Value: 507 Ohm
Lead Channel Pacing Threshold Pulse Width: 0.4 ms
Lead Channel Sensing Intrinsic Amplitude: 15 mV
MDC IDC LEAD IMPLANT DT: 20130903
MDC IDC LEAD LOCATION: 753860
MDC IDC MSMT LEADCHNL RV PACING THRESHOLD AMPLITUDE: 0.6 V
MDC IDC PG IMPLANT DT: 20130903
MDC IDC PG SERIAL: 66295010
MDC IDC STAT BRADY RV PERCENT PACED: 100 %

## 2017-02-01 NOTE — Progress Notes (Signed)
Remote pacemaker transmission.   

## 2017-02-02 ENCOUNTER — Encounter: Payer: Self-pay | Admitting: Cardiology

## 2017-04-23 DIAGNOSIS — R7303 Prediabetes: Secondary | ICD-10-CM | POA: Diagnosis not present

## 2017-04-23 DIAGNOSIS — N183 Chronic kidney disease, stage 3 (moderate): Secondary | ICD-10-CM | POA: Diagnosis not present

## 2017-04-23 DIAGNOSIS — G5602 Carpal tunnel syndrome, left upper limb: Secondary | ICD-10-CM | POA: Diagnosis not present

## 2017-04-23 DIAGNOSIS — I129 Hypertensive chronic kidney disease with stage 1 through stage 4 chronic kidney disease, or unspecified chronic kidney disease: Secondary | ICD-10-CM | POA: Diagnosis not present

## 2017-05-03 ENCOUNTER — Telehealth: Payer: Self-pay | Admitting: *Deleted

## 2017-05-03 ENCOUNTER — Ambulatory Visit (INDEPENDENT_AMBULATORY_CARE_PROVIDER_SITE_OTHER): Payer: Medicare Other | Admitting: *Deleted

## 2017-05-03 DIAGNOSIS — I442 Atrioventricular block, complete: Secondary | ICD-10-CM | POA: Diagnosis not present

## 2017-05-03 DIAGNOSIS — I4821 Permanent atrial fibrillation: Secondary | ICD-10-CM

## 2017-05-03 NOTE — Telephone Encounter (Signed)
New message ° ° ° °1. Has your device fired? no ° °2. Is you device beeping?no ° °3. Are you experiencing draining or swelling at device site? no ° °4. Are you calling to see if we received your device transmission? YES ° °5. Have you passed out? no °

## 2017-05-03 NOTE — Telephone Encounter (Signed)
Returned patient's call.  Advised that transmission was automatically received overnight.  Patient verbalizes understanding and appreciation.  She denies additional questions or concerns at this time.

## 2017-05-03 NOTE — Telephone Encounter (Signed)
Follow up    Pt is calling to see if transmission was received.

## 2017-05-04 NOTE — Progress Notes (Signed)
Remote pacemaker check. 

## 2017-05-10 LAB — CUP PACEART REMOTE DEVICE CHECK
Date Time Interrogation Session: 20180822061759
Implantable Lead Implant Date: 20130903
Implantable Lead Location: 753860
Implantable Lead Model: 4456
Implantable Lead Model: 5076
Implantable Lead Serial Number: 1111
Implantable Pulse Generator Implant Date: 20130903
Lead Channel Pacing Threshold Amplitude: 0.6 V
Lead Channel Setting Pacing Amplitude: 2.5 V
Lead Channel Setting Sensing Sensitivity: 2.5 mV
MDC IDC LEAD IMPLANT DT: 20130903
MDC IDC LEAD LOCATION: 753859
MDC IDC MSMT LEADCHNL RV IMPEDANCE VALUE: 521 Ohm
MDC IDC MSMT LEADCHNL RV PACING THRESHOLD PULSEWIDTH: 0.4 ms
MDC IDC SET LEADCHNL RV PACING PULSEWIDTH: 0.4 ms
MDC IDC STAT BRADY RV PERCENT PACED: 100 %
Pulse Gen Serial Number: 66295010

## 2017-05-17 ENCOUNTER — Other Ambulatory Visit: Payer: Self-pay | Admitting: Cardiology

## 2017-05-17 MED ORDER — RIVAROXABAN 15 MG PO TABS: 15.0000 mg | ORAL_TABLET | Freq: Every day | ORAL | 3 refills | Status: DC

## 2017-05-17 NOTE — Telephone Encounter (Addendum)
Pt is 83 yrs old, wt-80.1kg, Crea-1.27 on 04/23/17 per labs from PCP office, CrCl-39.35ml/min. Sent Dr. Curt Bears a msg to inform him that the pt is on incorrect dose per dosing criteria.  Pt states she will run out of Xarelto 20mg  on Saturday, therefore, sent in the correct dose of Xarelto 15mg  per dosing criteria to requested pharmacy.     Per message from Dr. Curt Bears dose has been changed to Xarelto 15mg  daily. Refill sent to requested pharmacy.

## 2017-05-18 ENCOUNTER — Encounter: Payer: Self-pay | Admitting: Cardiology

## 2017-07-16 DIAGNOSIS — Z23 Encounter for immunization: Secondary | ICD-10-CM | POA: Diagnosis not present

## 2017-08-02 ENCOUNTER — Ambulatory Visit (INDEPENDENT_AMBULATORY_CARE_PROVIDER_SITE_OTHER): Payer: Medicare Other | Admitting: *Deleted

## 2017-08-02 DIAGNOSIS — I442 Atrioventricular block, complete: Secondary | ICD-10-CM

## 2017-08-02 LAB — CUP PACEART REMOTE DEVICE CHECK
Implantable Lead Implant Date: 20130903
Implantable Lead Implant Date: 20130903
Implantable Lead Model: 4456
Implantable Lead Model: 5076
Implantable Lead Serial Number: 1111
Implantable Pulse Generator Implant Date: 20130903
MDC IDC LEAD LOCATION: 753859
MDC IDC LEAD LOCATION: 753860
MDC IDC SESS DTM: 20181130152645
Pulse Gen Serial Number: 66295010

## 2017-08-02 NOTE — Progress Notes (Signed)
Remote pacemaker transmission.   

## 2017-08-08 ENCOUNTER — Encounter: Payer: Self-pay | Admitting: Cardiology

## 2017-09-09 ENCOUNTER — Other Ambulatory Visit: Payer: Self-pay | Admitting: Cardiology

## 2017-09-10 NOTE — Telephone Encounter (Signed)
Pt is 83 yrs old, wt-80.1kg, Crea-1.27 via scanned labs from [facility] on 11/02/16, last seen by Dr. Curt Bears on 11/02/16 & has an appt in February (note placed on chart to obtain CBC & BMET as labs will be a year old) CrCl-38.45ml/min; will send in refill request to requested Pharmacy.

## 2017-10-30 DIAGNOSIS — N183 Chronic kidney disease, stage 3 (moderate): Secondary | ICD-10-CM | POA: Diagnosis not present

## 2017-10-30 DIAGNOSIS — Z136 Encounter for screening for cardiovascular disorders: Secondary | ICD-10-CM | POA: Diagnosis not present

## 2017-10-30 DIAGNOSIS — Z0000 Encounter for general adult medical examination without abnormal findings: Secondary | ICD-10-CM | POA: Diagnosis not present

## 2017-10-30 DIAGNOSIS — R7303 Prediabetes: Secondary | ICD-10-CM | POA: Diagnosis not present

## 2017-10-30 DIAGNOSIS — I129 Hypertensive chronic kidney disease with stage 1 through stage 4 chronic kidney disease, or unspecified chronic kidney disease: Secondary | ICD-10-CM | POA: Diagnosis not present

## 2017-11-01 ENCOUNTER — Ambulatory Visit (INDEPENDENT_AMBULATORY_CARE_PROVIDER_SITE_OTHER): Payer: Medicare Other | Admitting: *Deleted

## 2017-11-01 DIAGNOSIS — I442 Atrioventricular block, complete: Secondary | ICD-10-CM

## 2017-11-01 NOTE — Progress Notes (Signed)
Remote pacemaker transmission.   

## 2017-11-04 NOTE — Progress Notes (Signed)
Electrophysiology Office Note   Date:  11/05/2017   ID:  Penny Sanders, DOB 19-Aug-1927, MRN [id number]  PCP:  Harlan Stains, MD  Primary Electrophysiologist:  Kennethia Lynes Meredith Leeds, MD    Chief Complaint  Patient presents with  . Pacemaker Check    Permanent Afib/Complete heart block     History of Present Illness: Penny Sanders is a 84 y.o. female who presents today for electrophysiology evaluation.    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 She has persistent atrial fibrillation and has been programmed to VVIR. She currently takes rivaroxaban for her atrial fibrillation.  She had an echo done in 2013 that showed an EF greater than 65% and mild mitral regurgitation.   Today, denies symptoms of palpitations, chest pain, shortness of breath, orthopnea, PND, lower extremity edema, claudication, dizziness, presyncope, syncope, bleeding, or neurologic sequela. The patient is tolerating medications without difficulties.  Is currently feeling well without major complaint.  She is able to do all of her daily activities.  She has no limitations.   Past Medical History:  Diagnosis Date  . Hypertension   . Pacemaker    Past Surgical History:  Procedure Laterality Date  . ABDOMINAL HYSTERECTOMY    . APPENDECTOMY    . BREAST LUMPECTOMY    . CHOLECYSTECTOMY       Current Outpatient Medications  Medication Sig Dispense Refill  . acetaminophen (TYLENOL) 500 MG tablet Take 500 mg by mouth every 6 (six) hours as needed for mild pain.    Marland Kitchen amLODipine-benazepril (LOTREL) 5-10 MG per capsule Take 1 capsule by mouth daily.   0  . CALCIUM PO Take 1 tablet by mouth daily.    . cholecalciferol (VITAMIN D) 1000 UNITS tablet Take 1,000 Units by mouth 2 (two) times daily.    . hydrochlorothiazide (HYDRODIURIL) 12.5 MG tablet Take 12.5 mg by mouth daily.  0  . hydroxypropyl methylcellulose / hypromellose (ISOPTO TEARS  / GONIOVISC) 2.5 % ophthalmic solution Place 1 drop into both eyes as needed for dry eyes.    . multivitamin-lutein (OCUVITE-LUTEIN) CAPS capsule Take 1 capsule by mouth daily.    . potassium chloride (K-DUR,KLOR-CON) 10 MEQ tablet Take 10 mEq by mouth 2 (two) times daily.   0  . sodium chloride (OCEAN) 0.65 % SOLN nasal spray Place 1 spray into both nostrils as needed for congestion.    Alveda Reasons 15 MG TABS tablet TAKE 1 TABLET (15 MG TOTAL) BY MOUTH DAILY WITH SUPPER. 30 tablet 3   No current facility-administered medications for this visit.     Allergies:   Sulfa antibiotics   Social History:  The patient  reports that  has never smoked. she has never used smokeless tobacco. She reports that she does not drink alcohol.   Family History:  [name]   family history includes Anemia in her mother; Bladder Cancer in her father; Breast cancer in her sister; Cancer in her brother; Hypertension in her mother.    ROS:  Please see the history of present illness.   Otherwise, review of systems is positive for back pain, balance problems, falls.   All other systems are reviewed and negative.   PHYSICAL EXAM: VS:  BP 122/86   Pulse 78   Ht 5\' 1"  (1.549 m)   Wt 176 lb (79.8 kg)   BMI 33.25 kg/m  , BMI Body mass index is 33.25 kg/m. GEN: Well nourished, well  developed, in no acute distress  HEENT: normal  Neck: no JVD, carotid bruits, or masses Cardiac: RRR; no murmurs, rubs, or gallops,no edema  Respiratory:  clear to auscultation bilaterally, normal work of breathing GI: soft, nontender, nondistended, + BS MS: no deformity or atrophy  Skin: warm and dry, device site well healed Neuro:  Strength and sensation are intact Psych: euthymic mood, full affect  EKG:  EKG is ordered today. Personal review of the ekg ordered shows atrial fibrillation, V pace, rate 78  Personal review of the device interrogation today. Results in [place]: No results found for requested labs  within last 8760 hours.    Lipid Panel  No results found for: CHOL, TRIG, HDL, CHOLHDL, VLDL, LDLCALC, LDLDIRECT   Wt Readings from Last 3 Encounters:  11/05/17 176 lb (79.8 kg)  11/02/16 176 lb 9.6 oz (80.1 kg)  05/04/16 173 lb 12.8 oz (78.8 kg)      Other studies Reviewed: Additional studies/ records that were reviewed today include: cardiology notes   ASSESSMENT AND PLAN:  1.  Persistent atrial fibrillation: Currently on Xarelto.  In sinus rhythm today.  No changes.  This patients CHA2DS2-VASc Score and unadjusted Ischemic Stroke Rate (% per year) is equal to 4.8 % stroke rate/year from a score of 4  Above score calculated as 1 point each if present [CHF, HTN, DM, Vascular=MI/PAD/Aortic Plaque, Age if 65-74, or Female] Above score calculated as 2 points each if present [Age > 75, or Stroke/TIA/TE]   2.  Complete AV block: Biotronik dual-chamber pacemaker.  Device functioning appropriately.  No changes at this time.   Current medicines are reviewed at length with the patient today.   The patient does not have concerns regarding her medicines.  The following changes were made today:  none  Labs/ tests ordered today include:  Orders Placed This Encounter  Procedures  . CUP PACEART [place]  . EKG 12-Lead     Disposition:   FU with Yaziel Brandon 12 months  Signed, Lashaunda Schild Meredith Leeds, MD  11/05/2017 3:59 PM     [place] [address] [phone] (office) [phone] (fax)

## 2017-11-05 ENCOUNTER — Encounter: Payer: Self-pay | Admitting: Cardiology

## 2017-11-05 ENCOUNTER — Ambulatory Visit (INDEPENDENT_AMBULATORY_CARE_PROVIDER_SITE_OTHER): Payer: Medicare Other | Admitting: Cardiology

## 2017-11-05 VITALS — BP 122/86 | HR 78 | Ht 61.0 in | Wt 176.0 lb

## 2017-11-05 DIAGNOSIS — I442 Atrioventricular block, complete: Secondary | ICD-10-CM | POA: Diagnosis not present

## 2017-11-05 DIAGNOSIS — I10 Essential (primary) hypertension: Secondary | ICD-10-CM

## 2017-11-05 LAB — CUP PACEART INCLINIC DEVICE CHECK
Brady Statistic RV Percent Paced: 100 %
Date Time Interrogation Session: 20190218152833
Implantable Lead Implant Date: 20130903
Implantable Lead Implant Date: 20130903
Implantable Lead Location: 753859
Implantable Lead Serial Number: 1111
Implantable Pulse Generator Implant Date: 20130903
Lead Channel Impedance Value: 331 Ohm
Lead Channel Pacing Threshold Amplitude: 0.6 V
Lead Channel Pacing Threshold Pulse Width: 0.4 ms
Lead Channel Pacing Threshold Pulse Width: 0.4 ms
Lead Channel Setting Pacing Amplitude: 2.5 V
Lead Channel Setting Pacing Pulse Width: 0.4 ms
Lead Channel Setting Sensing Sensitivity: 2.5 mV
MDC IDC LEAD LOCATION: 753860
MDC IDC MSMT LEADCHNL RV IMPEDANCE VALUE: 546 Ohm
MDC IDC MSMT LEADCHNL RV PACING THRESHOLD AMPLITUDE: 0.6 V
Pulse Gen Serial Number: 66295010

## 2017-11-05 NOTE — Patient Instructions (Addendum)
Medication Instructions:  Your physician recommends that you continue on your current medications as directed. Please refer to the Current Medication list given to you today.  *If you need a refill on your cardiac medications before your next appointment, please call your pharmacy*  Labwork: None ordered  Testing/Procedures: None ordered  Follow-Up: Remote monitoring is used to monitor your Pacemaker or ICD from home. This monitoring reduces the number of office visits required to check your device to one time per year. It allows [place] to keep an eye on the functioning of your device to ensure it is working properly. You are scheduled for a device check from home on 01/31/2018. You may send your transmission at any time that day. If you have a wireless device, the transmission will be sent automatically. After your physician reviews your transmission, you will receive a postcard with your next transmission date.  Your physician wants you to follow-up in: 1 year with Dr. Curt Bears.  You will receive a reminder letter in the mail two months in advance. If you don't receive a letter, please call our office to schedule the follow-up appointment.  Thank you for choosing [facility]!!   Trinidad Curet, RN [phone]

## 2017-11-07 ENCOUNTER — Encounter: Payer: Self-pay | Admitting: Cardiology

## 2017-11-18 LAB — CUP PACEART REMOTE DEVICE CHECK
Date Time Interrogation Session: 20190303114326
Implantable Lead Implant Date: 20130903
Implantable Lead Location: 753859
Implantable Lead Location: 753860
Implantable Lead Model: 5076
Implantable Pulse Generator Implant Date: 20130903
MDC IDC LEAD IMPLANT DT: 20130903
MDC IDC LEAD SERIAL: 1111
Pulse Gen Serial Number: 66295010

## 2017-12-18 DIAGNOSIS — S0101XA Laceration without foreign body of scalp, initial encounter: Secondary | ICD-10-CM | POA: Diagnosis not present

## 2017-12-18 DIAGNOSIS — W19XXXA Unspecified fall, initial encounter: Secondary | ICD-10-CM | POA: Diagnosis not present

## 2017-12-18 DIAGNOSIS — S300XXA Contusion of lower back and pelvis, initial encounter: Secondary | ICD-10-CM | POA: Diagnosis not present

## 2018-01-07 ENCOUNTER — Other Ambulatory Visit: Payer: Self-pay | Admitting: Cardiology

## 2018-01-31 ENCOUNTER — Ambulatory Visit (INDEPENDENT_AMBULATORY_CARE_PROVIDER_SITE_OTHER): Payer: Medicare Other | Admitting: *Deleted

## 2018-01-31 DIAGNOSIS — I442 Atrioventricular block, complete: Secondary | ICD-10-CM

## 2018-01-31 DIAGNOSIS — Z950 Presence of cardiac pacemaker: Secondary | ICD-10-CM

## 2018-01-31 NOTE — Progress Notes (Signed)
Remote pacemaker transmission.   

## 2018-02-01 ENCOUNTER — Encounter: Payer: Self-pay | Admitting: Cardiology

## 2018-02-13 LAB — CUP PACEART REMOTE DEVICE CHECK
Battery Remaining Percentage: 55 %
Brady Statistic RV Percent Paced: 100 %
Implantable Lead Implant Date: 20130903
Implantable Lead Implant Date: 20130903
Implantable Lead Location: 753859
Implantable Lead Model: 4456
Implantable Lead Serial Number: 1111
Lead Channel Pacing Threshold Amplitude: 0.6 V
Lead Channel Pacing Threshold Pulse Width: 0.4 ms
Lead Channel Setting Pacing Amplitude: 2.5 V
Lead Channel Setting Pacing Pulse Width: 0.4 ms
Lead Channel Setting Sensing Sensitivity: 2.5 mV
MDC IDC LEAD LOCATION: 753860
MDC IDC MSMT LEADCHNL RV IMPEDANCE VALUE: 526 Ohm
MDC IDC PG IMPLANT DT: 20130903
MDC IDC SESS DTM: 20190529063530
Pulse Gen Serial Number: 66295010

## 2018-04-01 DIAGNOSIS — H47323 Drusen of optic disc, bilateral: Secondary | ICD-10-CM | POA: Diagnosis not present

## 2018-04-01 DIAGNOSIS — H353130 Nonexudative age-related macular degeneration, bilateral, stage unspecified: Secondary | ICD-10-CM | POA: Diagnosis not present

## 2018-04-01 DIAGNOSIS — Z961 Presence of intraocular lens: Secondary | ICD-10-CM | POA: Diagnosis not present

## 2018-05-02 ENCOUNTER — Ambulatory Visit (INDEPENDENT_AMBULATORY_CARE_PROVIDER_SITE_OTHER): Payer: Medicare Other | Admitting: *Deleted

## 2018-05-02 DIAGNOSIS — I442 Atrioventricular block, complete: Secondary | ICD-10-CM

## 2018-05-02 DIAGNOSIS — I10 Essential (primary) hypertension: Secondary | ICD-10-CM

## 2018-05-02 NOTE — Progress Notes (Signed)
Remote pacemaker transmission.   

## 2018-06-10 LAB — CUP PACEART REMOTE DEVICE CHECK
Date Time Interrogation Session: 20190923162112
Implantable Lead Implant Date: 20130903
Implantable Lead Implant Date: 20130903
Implantable Lead Location: 753860
Implantable Lead Model: 4456
Implantable Lead Model: 5076
Implantable Lead Serial Number: 1111
Implantable Pulse Generator Implant Date: 20130903
MDC IDC LEAD LOCATION: 753859
MDC IDC PG SERIAL: 66295010

## 2018-06-13 DIAGNOSIS — M720 Palmar fascial fibromatosis [Dupuytren]: Secondary | ICD-10-CM | POA: Diagnosis not present

## 2018-06-13 DIAGNOSIS — N183 Chronic kidney disease, stage 3 (moderate): Secondary | ICD-10-CM | POA: Diagnosis not present

## 2018-06-13 DIAGNOSIS — Z23 Encounter for immunization: Secondary | ICD-10-CM | POA: Diagnosis not present

## 2018-06-13 DIAGNOSIS — G5602 Carpal tunnel syndrome, left upper limb: Secondary | ICD-10-CM | POA: Diagnosis not present

## 2018-06-13 DIAGNOSIS — I129 Hypertensive chronic kidney disease with stage 1 through stage 4 chronic kidney disease, or unspecified chronic kidney disease: Secondary | ICD-10-CM | POA: Diagnosis not present

## 2018-06-13 DIAGNOSIS — R7303 Prediabetes: Secondary | ICD-10-CM | POA: Diagnosis not present

## 2018-08-01 ENCOUNTER — Ambulatory Visit (INDEPENDENT_AMBULATORY_CARE_PROVIDER_SITE_OTHER): Payer: Medicare Other | Admitting: *Deleted

## 2018-08-01 DIAGNOSIS — I442 Atrioventricular block, complete: Secondary | ICD-10-CM | POA: Diagnosis not present

## 2018-08-01 DIAGNOSIS — I10 Essential (primary) hypertension: Secondary | ICD-10-CM

## 2018-08-01 NOTE — Progress Notes (Signed)
Remote pacemaker transmission.   

## 2018-08-02 DIAGNOSIS — R252 Cramp and spasm: Secondary | ICD-10-CM | POA: Diagnosis not present

## 2018-08-02 DIAGNOSIS — H353130 Nonexudative age-related macular degeneration, bilateral, stage unspecified: Secondary | ICD-10-CM | POA: Diagnosis not present

## 2018-08-03 ENCOUNTER — Other Ambulatory Visit: Payer: Self-pay | Admitting: Cardiology

## 2018-08-05 NOTE — Telephone Encounter (Signed)
Xarelto 15mg  refill request received; pt is 84 yrs old, wt-79.8kg, Crea-1.42 on 08/02/18 via [facility] PCP, last seen by Dr. Curt Bears on 11/05/17, CrCl-33.84ml/min; will send in refill to requested pharmacy.

## 2018-08-21 DIAGNOSIS — M1811 Unilateral primary osteoarthritis of first carpometacarpal joint, right hand: Secondary | ICD-10-CM | POA: Diagnosis not present

## 2018-08-21 DIAGNOSIS — M720 Palmar fascial fibromatosis [Dupuytren]: Secondary | ICD-10-CM | POA: Diagnosis not present

## 2018-08-21 DIAGNOSIS — G5602 Carpal tunnel syndrome, left upper limb: Secondary | ICD-10-CM | POA: Diagnosis not present

## 2018-08-21 DIAGNOSIS — M79641 Pain in right hand: Secondary | ICD-10-CM | POA: Diagnosis not present

## 2018-09-29 LAB — CUP PACEART REMOTE DEVICE CHECK
Date Time Interrogation Session: 20200112160000
Implantable Lead Implant Date: 20130903
Implantable Lead Implant Date: 20130903
Implantable Lead Location: 753860
Implantable Lead Model: 4456
Implantable Lead Model: 5076
Implantable Lead Serial Number: 1111
Implantable Pulse Generator Implant Date: 20130903
MDC IDC LEAD LOCATION: 753859
Pulse Gen Serial Number: 66295010

## 2018-10-31 ENCOUNTER — Ambulatory Visit (INDEPENDENT_AMBULATORY_CARE_PROVIDER_SITE_OTHER): Payer: Medicare Other

## 2018-10-31 DIAGNOSIS — I442 Atrioventricular block, complete: Secondary | ICD-10-CM | POA: Diagnosis not present

## 2018-11-05 LAB — CUP PACEART REMOTE DEVICE CHECK
Implantable Lead Implant Date: 20130903
Implantable Lead Implant Date: 20130903
Implantable Lead Location: 753860
Implantable Lead Model: 4456
Implantable Lead Serial Number: 1111
Implantable Pulse Generator Implant Date: 20130903
MDC IDC LEAD LOCATION: 753859
MDC IDC SESS DTM: 20200218122644
Pulse Gen Serial Number: 66295010

## 2018-11-12 NOTE — Progress Notes (Signed)
Remote pacemaker transmission.   

## 2018-11-22 ENCOUNTER — Encounter: Payer: Medicare Other | Admitting: Cardiology

## 2018-12-05 ENCOUNTER — Encounter: Payer: Self-pay | Admitting: Cardiology

## 2018-12-13 ENCOUNTER — Encounter: Payer: Medicare Other | Admitting: Cardiology

## 2019-01-30 ENCOUNTER — Ambulatory Visit (INDEPENDENT_AMBULATORY_CARE_PROVIDER_SITE_OTHER): Payer: Medicare Other | Admitting: *Deleted

## 2019-01-30 ENCOUNTER — Other Ambulatory Visit: Payer: Self-pay

## 2019-01-30 DIAGNOSIS — I442 Atrioventricular block, complete: Secondary | ICD-10-CM | POA: Diagnosis not present

## 2019-02-02 LAB — CUP PACEART REMOTE DEVICE CHECK
Date Time Interrogation Session: 20200517072516
Implantable Lead Implant Date: 20130903
Implantable Lead Implant Date: 20130903
Implantable Lead Location: 753859
Implantable Lead Location: 753860
Implantable Lead Model: 4456
Implantable Lead Model: 5076
Implantable Lead Serial Number: 1111
Implantable Pulse Generator Implant Date: 20130903
Pulse Gen Serial Number: 66295010

## 2019-02-04 ENCOUNTER — Encounter: Payer: Self-pay | Admitting: Cardiology

## 2019-02-04 NOTE — Progress Notes (Signed)
Remote pacemaker transmission.   

## 2019-02-27 ENCOUNTER — Other Ambulatory Visit: Payer: Self-pay

## 2019-02-27 NOTE — Telephone Encounter (Signed)
Pt last saw Dr Curt Bears 11/05/17, overdue for 1 year follow-up appt. Will send staff message to Lorenda Hatchet, EP scheduler to contact pt to schedule follow-up appt. Last labs 08/02/18 Creat 1.42 in [place], age 85, weight 79.8kg, CrCl 33.84, based on CrCl pt is on appropriate dosage of Xarelto 15mg  QD.  Will refill once follow-up appt made with Dr Curt Bears.

## 2019-03-03 MED ORDER — RIVAROXABAN 15 MG PO TABS: 15.0000 mg | ORAL_TABLET | Freq: Every day | ORAL | 6 refills | Status: DC

## 2019-03-03 NOTE — Telephone Encounter (Signed)
Virtual visit scheduled 6/23.

## 2019-03-04 ENCOUNTER — Telehealth: Payer: Self-pay | Admitting: *Deleted

## 2019-03-04 NOTE — Telephone Encounter (Signed)

## 2019-03-11 ENCOUNTER — Other Ambulatory Visit: Payer: Self-pay

## 2019-03-11 ENCOUNTER — Telehealth (INDEPENDENT_AMBULATORY_CARE_PROVIDER_SITE_OTHER): Payer: Medicare Other | Admitting: Cardiology

## 2019-03-11 DIAGNOSIS — Z950 Presence of cardiac pacemaker: Secondary | ICD-10-CM

## 2019-03-11 DIAGNOSIS — I4821 Permanent atrial fibrillation: Secondary | ICD-10-CM

## 2019-03-11 DIAGNOSIS — I455 Other specified heart block: Secondary | ICD-10-CM | POA: Diagnosis not present

## 2019-03-11 NOTE — Progress Notes (Signed)
Electrophysiology TeleHealth Note   Due to national recommendations of social distancing due to COVID 19, an audio/video telehealth visit is felt to be most appropriate for this patient at this time.  See Epic message for the patient's consent to telehealth for [facility].   Date:  03/11/2019   ID:  Penny Sanders, DOB [birth year], MRN [id number]  Location: patient's home  Provider location: [address], [place]  Evaluation Performed: Follow-up visit  PCP:  Harlan Stains, MD  Cardiologist:  Marcel Sorter Meredith Leeds, MD  Electrophysiologist:  Dr Curt Bears  Chief Complaint:  AF  History of Present Illness:    Penny Sanders is a 85 y.o. female who presents via audio/video conferencing for a telehealth visit today.  Since last being seen in our clinic, the patient reports doing very well.  Today, she denies symptoms of palpitations, chest pain, shortness of breath,  lower extremity edema, dizziness, presyncope, or syncope.  The patient is otherwise without complaint today.  The patient denies symptoms of fevers, chills, cough, or new SOB worrisome for COVID 19.  She has a history significant for complete heart block status post Biotronik dual-chamber pacemaker implanted 05/21/2012.  She also has permanent atrial fibrillation on Xarelto.  Today, denies symptoms of palpitations, chest pain, shortness of breath, orthopnea, PND, lower extremity edema, claudication, dizziness, presyncope, syncope, bleeding, or neurologic sequela. The patient is tolerating medications without difficulties.  She continues to do all of her daily activities without restriction.  Past Medical History:  Diagnosis Date  . Hypertension   . Pacemaker     Past Surgical History:  Procedure Laterality Date  . ABDOMINAL HYSTERECTOMY    . APPENDECTOMY    . BREAST LUMPECTOMY    . CHOLECYSTECTOMY      Current Outpatient Medications  Medication Sig Dispense Refill  . acetaminophen (TYLENOL) 500 MG tablet  Take 500 mg by mouth every 6 (six) hours as needed for mild pain.    Marland Kitchen amLODipine-benazepril (LOTREL) 5-10 MG per capsule Take 1 capsule by mouth daily.   0  . CALCIUM PO Take 1 tablet by mouth daily.    . cholecalciferol (VITAMIN D) 1000 UNITS tablet Take 1,000 Units by mouth 2 (two) times daily.    . hydrochlorothiazide (HYDRODIURIL) 12.5 MG tablet Take 12.5 mg by mouth daily.  0  . hydroxypropyl methylcellulose / hypromellose (ISOPTO TEARS / GONIOVISC) 2.5 % ophthalmic solution Place 1 drop into both eyes as needed for dry eyes.    . multivitamin-lutein (OCUVITE-LUTEIN) CAPS capsule Take 1 capsule by mouth daily.    . potassium chloride (K-DUR,KLOR-CON) 10 MEQ tablet Take 10 mEq by mouth 2 (two) times daily.   0  . Rivaroxaban (XARELTO) 15 MG TABS tablet Take 1 tablet (15 mg total) by mouth daily with supper. 30 tablet 6  . sodium chloride (OCEAN) 0.65 % SOLN nasal spray Place 1 spray into both nostrils as needed for congestion.     No current facility-administered medications for this visit.     Allergies:   Sulfa antibiotics   Social History:  The patient  reports that she has never smoked. She has never used smokeless tobacco. She reports that she does not drink alcohol or use drugs.   Family History:  [name]  family history includes Anemia in her mother; Bladder Cancer in her father; Breast cancer in her sister; Cancer in her brother; Hypertension in her mother.   ROS:  Please see the history of present illness.  All other systems are personally reviewed and negative.    Exam:    Vital Signs:  BP 133/86   Pulse 72   Wt 166 lb (75.3 kg)   BMI 31.37 kg/m   Over the phone, no acute distress, no shortness of breath.  Labs/Other Tests and Data Reviewed:    Recent Labs: No results found for requested labs within last 8760 hours.   Wt Readings from Last 3 Encounters:  03/11/19 166 lb (75.3 kg)  11/05/17 176 lb (79.8 kg)  11/02/16 176 lb 9.6 oz (80.1 kg)     Other  studies personally reviewed: Additional studies/ records that were reviewed today include: ECG 11/05/2017 personally reviewed Review of the above records today demonstrates: Atrial fibrillation, ventricular  Last device remote is reviewed from [place] PDF dated 02/02/19 which reveals normal device function, no arrhythmias    ASSESSMENT & PLAN:    1.  Permanent atrial fibrillation: Currently on Xarelto.  No obvious symptoms from atrial fibrillation.  No changes.  This patients CHA2DS2-VASc Score and unadjusted Ischemic Stroke Rate (% per year) is equal to 4.8 % stroke rate/year from a score of 4  Above score calculated as 1 point each if present [CHF, HTN, DM, Vascular=MI/PAD/Aortic Plaque, Age if 65-74, or Female] Above score calculated as 2 points each if present [Age > 75, or Stroke/TIA/TE]  2.  Complete heart block: Status post Biotronik dual-chamber pacemaker.  Device functioning appropriately.  No changes.   COVID 19 screen The patient denies symptoms of COVID 19 at this time.  The importance of social distancing was discussed today.  Follow-up: 1 year  Current medicines are reviewed at length with the patient today.   The patient does not have concerns regarding her medicines.  The following changes were made today:  none  Labs/ tests ordered today include:  No orders of the defined types were placed in this encounter.    Patient Risk:  after full review of this patients clinical status, I feel that they are at moderate risk at this time.  Today, I have spent 8 minutes with the patient with telehealth technology discussing pacemaker, atrial fibrillation.    Signed, Kamdon Reisig Meredith Leeds, MD  03/11/2019 11:00 AM     [place] [address] [phone] (office) [phone] (fax)

## 2019-03-20 DIAGNOSIS — N183 Chronic kidney disease, stage 3 (moderate): Secondary | ICD-10-CM | POA: Diagnosis not present

## 2019-03-20 DIAGNOSIS — R7303 Prediabetes: Secondary | ICD-10-CM | POA: Diagnosis not present

## 2019-03-20 DIAGNOSIS — I129 Hypertensive chronic kidney disease with stage 1 through stage 4 chronic kidney disease, or unspecified chronic kidney disease: Secondary | ICD-10-CM | POA: Diagnosis not present

## 2019-03-20 DIAGNOSIS — M25551 Pain in right hip: Secondary | ICD-10-CM | POA: Diagnosis not present

## 2019-05-01 ENCOUNTER — Ambulatory Visit (INDEPENDENT_AMBULATORY_CARE_PROVIDER_SITE_OTHER): Payer: Medicare Other | Admitting: *Deleted

## 2019-05-01 DIAGNOSIS — I442 Atrioventricular block, complete: Secondary | ICD-10-CM

## 2019-05-02 LAB — CUP PACEART REMOTE DEVICE CHECK
Date Time Interrogation Session: 20200814092413
Implantable Lead Implant Date: 20130903
Implantable Lead Implant Date: 20130903
Implantable Lead Location: 753859
Implantable Lead Location: 753860
Implantable Lead Model: 4456
Implantable Lead Model: 5076
Implantable Lead Serial Number: 1111
Implantable Pulse Generator Implant Date: 20130903
Pulse Gen Serial Number: 66295010

## 2019-05-09 ENCOUNTER — Encounter: Payer: Self-pay | Admitting: Cardiology

## 2019-05-09 NOTE — Progress Notes (Signed)
Remote pacemaker transmission.   

## 2019-07-25 DIAGNOSIS — M1611 Unilateral primary osteoarthritis, right hip: Secondary | ICD-10-CM | POA: Diagnosis not present

## 2019-07-25 DIAGNOSIS — M25551 Pain in right hip: Secondary | ICD-10-CM | POA: Diagnosis not present

## 2019-07-30 DIAGNOSIS — Z23 Encounter for immunization: Secondary | ICD-10-CM | POA: Diagnosis not present

## 2019-07-31 ENCOUNTER — Ambulatory Visit (INDEPENDENT_AMBULATORY_CARE_PROVIDER_SITE_OTHER): Payer: Medicare Other | Admitting: *Deleted

## 2019-07-31 DIAGNOSIS — I442 Atrioventricular block, complete: Secondary | ICD-10-CM

## 2019-07-31 LAB — CUP PACEART REMOTE DEVICE CHECK
Battery Remaining Percentage: 45 %
Date Time Interrogation Session: 20201112205748
Implantable Lead Implant Date: 20130903
Implantable Lead Implant Date: 20130903
Implantable Lead Location: 753859
Implantable Lead Location: 753860
Implantable Lead Model: 4456
Implantable Lead Model: 5076
Implantable Lead Serial Number: 1111
Implantable Pulse Generator Implant Date: 20130903
Lead Channel Impedance Value: 507 Ohm
Lead Channel Pacing Threshold Amplitude: 0.7 V
Lead Channel Pacing Threshold Pulse Width: 0.4 ms
Lead Channel Sensing Intrinsic Amplitude: 15.9 mV
Lead Channel Setting Pacing Amplitude: 2.5 V
Lead Channel Setting Pacing Pulse Width: 0.4 ms
Lead Channel Setting Sensing Sensitivity: 2.5 mV
Pulse Gen Serial Number: 66295010

## 2019-08-21 NOTE — Progress Notes (Signed)
Remote pacemaker transmission.   

## 2019-09-03 ENCOUNTER — Telehealth: Payer: Self-pay | Admitting: *Deleted

## 2019-09-03 NOTE — Telephone Encounter (Signed)
   Primary Cardiologist: Will Meredith Leeds, MD  Chart reviewed as part of pre-operative protocol coverage. Given past medical history and time since last visit, based on ACC/AHA guidelines, Penny Sanders would be at acceptable risk for the planned procedure without further cardiovascular testing.   Patient with diagnosis of afib on Xarelto for anticoagulation.    Procedure: 6 TEETH TO BE EXTRACTED Date of procedure: 09/17/2019  CHADS2-VASc score of  4 ( HTN, AGE, AGE, female)  CrCl 28 ml/min  Per office protocol, patient can hold Xarelto for 2 days prior to procedure.    I will route this recommendation to the requesting party via Epic fax function and remove from pre-op pool.  Please call with questions.  Kathyrn Drown, NP 09/03/2019, 3:02 PM

## 2019-09-03 NOTE — Telephone Encounter (Signed)
   [facility] Pre-operative Risk Assessment    Request for surgical clearance:  1. What type of surgery is being performed? 6 TEETH TO BE EXTRACTED   2. When is this surgery scheduled? 09/17/19  3. What type of clearance is required (medical clearance vs. Pharmacy clearance to hold med vs. Both)? PHARM  4. Are there any medications that need to be held prior to surgery and how long?  [place]  5. Practice name and name of physician performing surgery? [facility]; DR. Percell Miller [place]   6. What is your office phone number [phone]    7.   What is your office fax number [phone]  8.   Anesthesia type (None, local, MAC, general) ? NOT LISTED   Julaine Hua 09/03/2019, 1:40 PM  _________________________________________________________________   (provider comments below)

## 2019-09-03 NOTE — Telephone Encounter (Addendum)
Patient with diagnosis of afib on Xarelto for anticoagulation.    Procedure: 6 TEETH TO BE EXTRACTED Date of procedure: 09/17/2019  CHADS2-VASc score of  4 ( HTN, AGE, AGE, female)  CrCl 22 ml/min  Per office protocol, patient can hold Xarelto for 2 days prior to procedure.

## 2019-10-26 ENCOUNTER — Other Ambulatory Visit: Payer: Self-pay | Admitting: Cardiology

## 2019-10-27 NOTE — Telephone Encounter (Signed)
Prescription refill request for Xarelto received.   Last office visit: 03/11/2019, Telemedicine, [place] Weight: 75.3 kg  Age: 86 y.o. Scr: 1.56, 03/20/2019 CrCl: 28 ml/min   Prescription refill sent.

## 2019-10-30 ENCOUNTER — Ambulatory Visit (INDEPENDENT_AMBULATORY_CARE_PROVIDER_SITE_OTHER): Payer: Medicare Other | Admitting: *Deleted

## 2019-10-30 DIAGNOSIS — Z950 Presence of cardiac pacemaker: Secondary | ICD-10-CM

## 2019-10-30 LAB — CUP PACEART REMOTE DEVICE CHECK
Battery Remaining Percentage: 45 %
Brady Statistic RV Percent Paced: 100 %
Date Time Interrogation Session: 20210211074348
Implantable Lead Implant Date: 20130903
Implantable Lead Implant Date: 20130903
Implantable Lead Location: 753859
Implantable Lead Location: 753860
Implantable Lead Model: 4456
Implantable Lead Model: 5076
Implantable Lead Serial Number: 1111
Implantable Pulse Generator Implant Date: 20130903
Lead Channel Impedance Value: 507 Ohm
Lead Channel Pacing Threshold Amplitude: 0.6 V
Lead Channel Pacing Threshold Pulse Width: 0.4 ms
Lead Channel Sensing Intrinsic Amplitude: 16.6 mV
Lead Channel Setting Pacing Amplitude: 2.5 V
Lead Channel Setting Pacing Pulse Width: 0.4 ms
Lead Channel Setting Sensing Sensitivity: 2.5 mV
Pulse Gen Serial Number: 66295010

## 2019-10-30 NOTE — Progress Notes (Signed)
PPM Remote  

## 2019-11-28 DIAGNOSIS — R140 Abdominal distension (gaseous): Secondary | ICD-10-CM | POA: Diagnosis not present

## 2019-12-29 DIAGNOSIS — Z0000 Encounter for general adult medical examination without abnormal findings: Secondary | ICD-10-CM | POA: Diagnosis not present

## 2019-12-29 DIAGNOSIS — I129 Hypertensive chronic kidney disease with stage 1 through stage 4 chronic kidney disease, or unspecified chronic kidney disease: Secondary | ICD-10-CM | POA: Diagnosis not present

## 2019-12-29 DIAGNOSIS — R7303 Prediabetes: Secondary | ICD-10-CM | POA: Diagnosis not present

## 2019-12-29 DIAGNOSIS — Z950 Presence of cardiac pacemaker: Secondary | ICD-10-CM | POA: Diagnosis not present

## 2019-12-29 DIAGNOSIS — N183 Chronic kidney disease, stage 3 unspecified: Secondary | ICD-10-CM | POA: Diagnosis not present

## 2019-12-29 DIAGNOSIS — Z1389 Encounter for screening for other disorder: Secondary | ICD-10-CM | POA: Diagnosis not present

## 2020-01-26 DIAGNOSIS — C44622 Squamous cell carcinoma of skin of right upper limb, including shoulder: Secondary | ICD-10-CM | POA: Diagnosis not present

## 2020-01-26 DIAGNOSIS — D485 Neoplasm of uncertain behavior of skin: Secondary | ICD-10-CM | POA: Diagnosis not present

## 2020-01-29 ENCOUNTER — Ambulatory Visit (INDEPENDENT_AMBULATORY_CARE_PROVIDER_SITE_OTHER): Payer: Medicare Other | Admitting: *Deleted

## 2020-01-29 DIAGNOSIS — I442 Atrioventricular block, complete: Secondary | ICD-10-CM

## 2020-01-30 LAB — CUP PACEART REMOTE DEVICE CHECK
Date Time Interrogation Session: 20210514070607
Implantable Lead Implant Date: 20130903
Implantable Lead Implant Date: 20130903
Implantable Lead Location: 753859
Implantable Lead Location: 753860
Implantable Lead Model: 4456
Implantable Lead Model: 5076
Implantable Lead Serial Number: 1111
Implantable Pulse Generator Implant Date: 20130903
Pulse Gen Serial Number: 66295010

## 2020-01-30 NOTE — Progress Notes (Signed)
Remote pacemaker transmission.   

## 2020-02-17 DIAGNOSIS — C44622 Squamous cell carcinoma of skin of right upper limb, including shoulder: Secondary | ICD-10-CM | POA: Diagnosis not present

## 2020-03-11 ENCOUNTER — Ambulatory Visit (INDEPENDENT_AMBULATORY_CARE_PROVIDER_SITE_OTHER): Payer: Medicare Other | Admitting: Cardiology

## 2020-03-11 ENCOUNTER — Other Ambulatory Visit: Payer: Self-pay

## 2020-03-11 ENCOUNTER — Encounter: Payer: Self-pay | Admitting: Cardiology

## 2020-03-11 VITALS — BP 134/74 | HR 70 | Ht 61.0 in | Wt 160.4 lb

## 2020-03-11 DIAGNOSIS — Z79899 Other long term (current) drug therapy: Secondary | ICD-10-CM | POA: Diagnosis not present

## 2020-03-11 DIAGNOSIS — I442 Atrioventricular block, complete: Secondary | ICD-10-CM

## 2020-03-11 LAB — CUP PACEART INCLINIC DEVICE CHECK
Date Time Interrogation Session: 20210624144900
Implantable Lead Implant Date: 20130903
Implantable Lead Implant Date: 20130903
Implantable Lead Location: 753859
Implantable Lead Location: 753860
Implantable Lead Model: 4456
Implantable Lead Model: 5076
Implantable Lead Serial Number: 1111
Implantable Pulse Generator Implant Date: 20130903
Lead Channel Impedance Value: 312 Ohm
Lead Channel Impedance Value: 546 Ohm
Lead Channel Pacing Threshold Amplitude: 0.7 V
Lead Channel Pacing Threshold Pulse Width: 0.4 ms
Lead Channel Sensing Intrinsic Amplitude: 1.1 mV
Lead Channel Setting Pacing Amplitude: 2.5 V
Lead Channel Setting Pacing Pulse Width: 0.4 ms
Lead Channel Setting Sensing Sensitivity: 2.5 mV
Pulse Gen Serial Number: 66295010

## 2020-03-11 NOTE — Patient Instructions (Signed)
Medication Instructions:  Your physician recommends that you continue on your current medications as directed. Please refer to the Current Medication list given to you today.  *If you need a refill on your cardiac medications before your next appointment, please call your pharmacy*   Lab Work: Xarelto surveillance labs today: BMET  & CBC If you have labs (blood work) drawn today and your tests are completely normal, you will receive your results only by: Marland Kitchen MyChart Message (if you have MyChart) OR . A paper copy in the mail If you have any lab test that is abnormal or we need to change your treatment, we will call you to review the results.   Testing/Procedures: None ordered   Follow-Up: Remote monitoring is used to monitor your Pacemaker of ICD from home. This monitoring reduces the number of office visits required to check your device to one time per year. It allows [place] to keep an eye on the functioning of your device to ensure it is working properly. You are scheduled for a device check from home on 04/29/2020 . You may send your transmission at any time that day. If you have a wireless device, the transmission will be sent automatically. After your physician reviews your transmission, you will receive a postcard with your next transmission date.  At [facility], you and your health needs are our priority.  As part of our continuing mission to provide you with exceptional heart care, we have created designated Provider Care Teams.  These Care Teams include your primary Cardiologist (physician) and Advanced Practice Providers (APPs -  Physician Assistants and Nurse Practitioners) who all work together to provide you with the care you need, when you need it.  We recommend signing up for the patient portal called "MyChart".  Sign up information is provided on this After Visit Summary.  MyChart is used to connect with patients for Virtual Visits (Telemedicine).  Patients are able to view  lab/test results, encounter notes, upcoming appointments, etc.  Non-urgent messages can be sent to your provider as well.   To learn more about what you can do with MyChart, go to [email or web address].    Your next appointment:   1 year  The format for your next appointment:   In Person  Provider:   Allegra Lai, MD   Thank you for choosing [facility]!!   Trinidad Curet, RN [phone]    Other Instructions

## 2020-03-11 NOTE — Progress Notes (Signed)
Electrophysiology Office Note   Date:  03/11/2020   ID:  Penny Sanders, DOB [birth year], MRN [id number]  PCP:  Harlan Stains, MD  Primary Electrophysiologist:  Oneill Bais Meredith Leeds, MD    No chief complaint on file.    History of Present Illness: Penny Sanders is a 86 y.o. female who presents today for electrophysiology evaluation.    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 She has persistent atrial fibrillation and has been programmed to VVIR. She currently takes rivaroxaban for her atrial fibrillation.  She had an echo done in 2013 that showed an EF greater than 65% and mild mitral regurgitation.   Today, denies symptoms of palpitations, chest pain, shortness of breath, orthopnea, PND, lower extremity edema, claudication, dizziness, presyncope, syncope, bleeding, or neurologic sequela. The patient is tolerating medications without difficulties.  She currently feels well today.  She is able to do all of her daily restriction.  She is not short of breath and fatigue due to her atrial fibrillation.   Past Medical History:  Diagnosis Date  . Hypertension   . Pacemaker    Past Surgical History:  Procedure Laterality Date  . ABDOMINAL HYSTERECTOMY    . APPENDECTOMY    . BREAST LUMPECTOMY    . CHOLECYSTECTOMY       Current Outpatient Medications  Medication Sig Dispense Refill  . acetaminophen (TYLENOL) 500 MG tablet Take 500 mg by mouth every 6 (six) hours as needed for mild pain.    Marland Kitchen amLODipine-benazepril (LOTREL) 5-10 MG per capsule Take 1 capsule by mouth daily.   0  . CALCIUM PO Take 1 tablet by mouth daily.    . cholecalciferol (VITAMIN D) 1000 UNITS tablet Take 1,000 Units by mouth 2 (two) times daily.    . hydrochlorothiazide (HYDRODIURIL) 12.5 MG tablet Take 12.5 mg by mouth daily.  0  . hydroxypropyl methylcellulose / hypromellose (ISOPTO TEARS / GONIOVISC) 2.5 % ophthalmic solution Place  1 drop into both eyes as needed for dry eyes.    . multivitamin-lutein (OCUVITE-LUTEIN) CAPS capsule Take 1 capsule by mouth daily.    . potassium chloride (K-DUR,KLOR-CON) 10 MEQ tablet Take 10 mEq by mouth 2 (two) times daily.   0  . sodium chloride (OCEAN) 0.65 % SOLN nasal spray Place 1 spray into both nostrils as needed for congestion.    Alveda Reasons 15 MG TABS tablet TAKE 1 TABLET (15 MG TOTAL) BY MOUTH DAILY WITH SUPPER. 30 tablet 6   No current facility-administered medications for this visit.    Allergies:   Sulfa antibiotics   Social History:  The patient  reports that she has never smoked. She has never used smokeless tobacco. She reports that she does not drink alcohol and does not use drugs.   Family History:  [name]   family history includes Anemia in her mother; Bladder Cancer in her father; Breast cancer in her sister; Cancer in her brother; Hypertension in her mother.    ROS:  Please see the history of present illness.   Otherwise, review of systems is positive for none.   All other systems are reviewed and negative.   PHYSICAL EXAM: VS:  BP 134/74   Pulse 70   Ht 5\' 1"  (1.549 m)   Wt 160 lb 6.4 oz (72.8 kg)   SpO2 94%   BMI 30.31 kg/m  , BMI Body mass index is 30.31 kg/m. GEN: Well nourished, well  developed, in no acute distress  HEENT: normal  Neck: no JVD, carotid bruits, or masses Cardiac: RRR; no murmurs, rubs, or gallops,no edema  Respiratory:  clear to auscultation bilaterally, normal work of breathing GI: soft, nontender, nondistended, + BS MS: no deformity or atrophy  Skin: warm and dry, device site well healed Neuro:  Strength and sensation are intact Psych: euthymic mood, full affect  EKG:  EKG is ordered today. Personal review of the ekg ordered shows atrial fibrillation, ventricular paced  Personal review of the device interrogation today. Results in [place]: No results found for requested labs within last 8760 hours.     Lipid Panel  No results found for: CHOL, TRIG, HDL, CHOLHDL, VLDL, LDLCALC, LDLDIRECT   Wt Readings from Last 3 Encounters:  03/11/20 160 lb 6.4 oz (72.8 kg)  03/11/19 166 lb (75.3 kg)  11/05/17 176 lb (79.8 kg)      Other studies Reviewed: Additional studies/ records that were reviewed today include: cardiology notes   ASSESSMENT AND PLAN:  1.  Permanent atrial fibrillation: Currently on Xarelto.  In sinus rhythm.  CHA2DS2-VASc of 4.  No changes.  We Zayvien Canning check labs for Xarelto, high risk medication monitoring.   2.  Complete AV block: Status post Biotronik dual-chamber pacemaker.  Device functioning appropriately.  No changes at this time.   Current medicines are reviewed at length with the patient today.   The patient does not have concerns regarding her medicines.  The following changes were made today: None  Labs/ tests ordered today include:  Orders Placed This Encounter  Procedures  . Basic metabolic panel  . CBC  . CUP PACEART [place]  . EKG 12-Lead     Disposition:   FU with Railey Glad 12 months  Signed, Latishia Suitt Meredith Leeds, MD  03/11/2020 3:23 PM     [facility] [address] [place] [address] [phone] (office) [phone] (fax)

## 2020-03-12 LAB — CBC
Hematocrit: 41.7 % (ref 34.0–46.6)
Hemoglobin: 13.9 g/dL (ref 11.1–15.9)
MCH: 31.1 pg (ref 26.6–33.0)
MCHC: 33.3 g/dL (ref 31.5–35.7)
MCV: 93 fL (ref 79–97)
Platelets: 237 10*3/uL (ref 150–450)
RBC: 4.47 x10E6/uL (ref 3.77–5.28)
RDW: 13 % (ref 11.7–15.4)
WBC: 7.7 10*3/uL (ref 3.4–10.8)

## 2020-03-12 LAB — BASIC METABOLIC PANEL
BUN/Creatinine Ratio: 19 (ref 12–28)
BUN: 28 mg/dL (ref 10–36)
CO2: 24 mmol/L (ref 20–29)
Calcium: 9.7 mg/dL (ref 8.7–10.3)
Chloride: 103 mmol/L (ref 96–106)
Creatinine, Ser: 1.44 mg/dL — ABNORMAL HIGH (ref 0.57–1.00)
GFR calc Af Amer: 37 mL/min/{1.73_m2} — ABNORMAL LOW (ref >59)
GFR calc non Af Amer: 32 mL/min/{1.73_m2} — ABNORMAL LOW (ref >59)
Glucose: 124 mg/dL — ABNORMAL HIGH (ref 65–99)
Potassium: 4.9 mmol/L (ref 3.5–5.2)
Sodium: 141 mmol/L (ref 134–144)

## 2020-04-29 ENCOUNTER — Ambulatory Visit (INDEPENDENT_AMBULATORY_CARE_PROVIDER_SITE_OTHER): Payer: Medicare Other | Admitting: *Deleted

## 2020-04-29 DIAGNOSIS — I442 Atrioventricular block, complete: Secondary | ICD-10-CM | POA: Diagnosis not present

## 2020-04-29 LAB — CUP PACEART REMOTE DEVICE CHECK
Battery Remaining Percentage: 45 %
Brady Statistic RV Percent Paced: 100 %
Date Time Interrogation Session: 20210812064426
Implantable Lead Implant Date: 20130903
Implantable Lead Implant Date: 20130903
Implantable Lead Location: 753859
Implantable Lead Location: 753860
Implantable Lead Model: 4456
Implantable Lead Model: 5076
Implantable Lead Serial Number: 1111
Implantable Pulse Generator Implant Date: 20130903
Lead Channel Impedance Value: 546 Ohm
Lead Channel Pacing Threshold Amplitude: 0.6 V
Lead Channel Pacing Threshold Pulse Width: 0.4 ms
Lead Channel Sensing Intrinsic Amplitude: 16.7 mV
Lead Channel Setting Pacing Amplitude: 2.5 V
Lead Channel Setting Pacing Pulse Width: 0.4 ms
Lead Channel Setting Sensing Sensitivity: 2.5 mV
Pulse Gen Serial Number: 66295010

## 2020-05-03 NOTE — Progress Notes (Signed)
Remote pacemaker transmission.   

## 2020-06-03 ENCOUNTER — Other Ambulatory Visit: Payer: Self-pay | Admitting: Cardiology

## 2020-06-03 NOTE — Telephone Encounter (Signed)
Xarelto 15mg  refill request received. Pt is 86 years old, weight-72.8kg, Crea-1.44 on 03/11/2020, last seen by Dr. Curt Bears on 03/11/2020, Diagnosis-Afib, CrCl-29.33ml/min; Dose is appropriate based on dosing criteria. Will send in refill to requested pharmacy.

## 2020-06-14 DIAGNOSIS — H04123 Dry eye syndrome of bilateral lacrimal glands: Secondary | ICD-10-CM | POA: Diagnosis not present

## 2020-06-14 DIAGNOSIS — H47323 Drusen of optic disc, bilateral: Secondary | ICD-10-CM | POA: Diagnosis not present

## 2020-06-14 DIAGNOSIS — Z961 Presence of intraocular lens: Secondary | ICD-10-CM | POA: Diagnosis not present

## 2020-06-14 DIAGNOSIS — H353131 Nonexudative age-related macular degeneration, bilateral, early dry stage: Secondary | ICD-10-CM | POA: Diagnosis not present

## 2020-07-29 ENCOUNTER — Ambulatory Visit (INDEPENDENT_AMBULATORY_CARE_PROVIDER_SITE_OTHER): Payer: Medicare Other

## 2020-07-29 DIAGNOSIS — I442 Atrioventricular block, complete: Secondary | ICD-10-CM | POA: Diagnosis not present

## 2020-07-29 LAB — CUP PACEART REMOTE DEVICE CHECK
Battery Remaining Percentage: 40 %
Brady Statistic RV Percent Paced: 100 %
Date Time Interrogation Session: 20211111075742
Implantable Lead Implant Date: 20130903
Implantable Lead Implant Date: 20130903
Implantable Lead Location: 753859
Implantable Lead Location: 753860
Implantable Lead Model: 4456
Implantable Lead Model: 5076
Implantable Lead Serial Number: 1111
Implantable Pulse Generator Implant Date: 20130903
Lead Channel Impedance Value: 507 Ohm
Lead Channel Pacing Threshold Amplitude: 0.7 V
Lead Channel Pacing Threshold Pulse Width: 0.4 ms
Lead Channel Sensing Intrinsic Amplitude: 17.2 mV
Lead Channel Setting Pacing Amplitude: 2.5 V
Lead Channel Setting Pacing Pulse Width: 0.4 ms
Lead Channel Setting Sensing Sensitivity: 2.5 mV
Pulse Gen Serial Number: 66295010

## 2020-07-30 NOTE — Progress Notes (Signed)
Remote pacemaker transmission.   

## 2020-09-27 DIAGNOSIS — Z950 Presence of cardiac pacemaker: Secondary | ICD-10-CM | POA: Diagnosis not present

## 2020-09-27 DIAGNOSIS — I129 Hypertensive chronic kidney disease with stage 1 through stage 4 chronic kidney disease, or unspecified chronic kidney disease: Secondary | ICD-10-CM | POA: Diagnosis not present

## 2020-09-27 DIAGNOSIS — E441 Mild protein-calorie malnutrition: Secondary | ICD-10-CM | POA: Diagnosis not present

## 2020-09-27 DIAGNOSIS — E2839 Other primary ovarian failure: Secondary | ICD-10-CM | POA: Diagnosis not present

## 2020-09-27 DIAGNOSIS — N183 Chronic kidney disease, stage 3 unspecified: Secondary | ICD-10-CM | POA: Diagnosis not present

## 2020-09-27 DIAGNOSIS — I4819 Other persistent atrial fibrillation: Secondary | ICD-10-CM | POA: Diagnosis not present

## 2020-09-27 DIAGNOSIS — D6869 Other thrombophilia: Secondary | ICD-10-CM | POA: Diagnosis not present

## 2020-09-27 DIAGNOSIS — R7303 Prediabetes: Secondary | ICD-10-CM | POA: Diagnosis not present

## 2020-10-02 ENCOUNTER — Other Ambulatory Visit: Payer: Self-pay | Admitting: Family Medicine

## 2020-10-02 DIAGNOSIS — E2839 Other primary ovarian failure: Secondary | ICD-10-CM

## 2020-10-18 DIAGNOSIS — Z961 Presence of intraocular lens: Secondary | ICD-10-CM | POA: Diagnosis not present

## 2020-10-18 DIAGNOSIS — H47323 Drusen of optic disc, bilateral: Secondary | ICD-10-CM | POA: Diagnosis not present

## 2020-10-18 DIAGNOSIS — H04123 Dry eye syndrome of bilateral lacrimal glands: Secondary | ICD-10-CM | POA: Diagnosis not present

## 2020-10-18 DIAGNOSIS — H353130 Nonexudative age-related macular degeneration, bilateral, stage unspecified: Secondary | ICD-10-CM | POA: Diagnosis not present

## 2020-10-28 ENCOUNTER — Ambulatory Visit (INDEPENDENT_AMBULATORY_CARE_PROVIDER_SITE_OTHER): Payer: Medicare Other

## 2020-10-28 DIAGNOSIS — I442 Atrioventricular block, complete: Secondary | ICD-10-CM | POA: Diagnosis not present

## 2020-10-28 LAB — CUP PACEART REMOTE DEVICE CHECK
Battery Remaining Percentage: 40 %
Brady Statistic RV Percent Paced: 100 %
Date Time Interrogation Session: 20220210094348
Implantable Lead Implant Date: 20130903
Implantable Lead Implant Date: 20130903
Implantable Lead Location: 753859
Implantable Lead Location: 753860
Implantable Lead Model: 4456
Implantable Lead Model: 5076
Implantable Lead Serial Number: 1111
Implantable Pulse Generator Implant Date: 20130903
Lead Channel Impedance Value: 507 Ohm
Lead Channel Pacing Threshold Amplitude: 0.7 V
Lead Channel Pacing Threshold Pulse Width: 0.4 ms
Lead Channel Sensing Intrinsic Amplitude: 16.1 mV
Lead Channel Setting Pacing Amplitude: 2.5 V
Lead Channel Setting Pacing Pulse Width: 0.4 ms
Lead Channel Setting Sensing Sensitivity: 2.5 mV
Pulse Gen Serial Number: 66295010

## 2020-11-03 NOTE — Progress Notes (Signed)
Remote pacemaker transmission.   

## 2020-12-29 DIAGNOSIS — R413 Other amnesia: Secondary | ICD-10-CM | POA: Diagnosis not present

## 2020-12-29 DIAGNOSIS — N183 Chronic kidney disease, stage 3 unspecified: Secondary | ICD-10-CM | POA: Diagnosis not present

## 2020-12-29 DIAGNOSIS — Z0000 Encounter for general adult medical examination without abnormal findings: Secondary | ICD-10-CM | POA: Diagnosis not present

## 2020-12-29 DIAGNOSIS — D6869 Other thrombophilia: Secondary | ICD-10-CM | POA: Diagnosis not present

## 2020-12-29 DIAGNOSIS — I129 Hypertensive chronic kidney disease with stage 1 through stage 4 chronic kidney disease, or unspecified chronic kidney disease: Secondary | ICD-10-CM | POA: Diagnosis not present

## 2020-12-29 DIAGNOSIS — R7303 Prediabetes: Secondary | ICD-10-CM | POA: Diagnosis not present

## 2020-12-29 DIAGNOSIS — I4819 Other persistent atrial fibrillation: Secondary | ICD-10-CM | POA: Diagnosis not present

## 2020-12-29 DIAGNOSIS — E2839 Other primary ovarian failure: Secondary | ICD-10-CM | POA: Diagnosis not present

## 2020-12-29 DIAGNOSIS — Z950 Presence of cardiac pacemaker: Secondary | ICD-10-CM | POA: Diagnosis not present

## 2020-12-29 DIAGNOSIS — R1032 Left lower quadrant pain: Secondary | ICD-10-CM | POA: Diagnosis not present

## 2021-01-27 ENCOUNTER — Ambulatory Visit (INDEPENDENT_AMBULATORY_CARE_PROVIDER_SITE_OTHER): Payer: Medicare Other

## 2021-01-27 DIAGNOSIS — I442 Atrioventricular block, complete: Secondary | ICD-10-CM | POA: Diagnosis not present

## 2021-01-27 LAB — CUP PACEART REMOTE DEVICE CHECK
Date Time Interrogation Session: 20220512101815
Implantable Lead Implant Date: 20130903
Implantable Lead Implant Date: 20130903
Implantable Lead Location: 753859
Implantable Lead Location: 753860
Implantable Lead Model: 4456
Implantable Lead Model: 5076
Implantable Lead Serial Number: 1111
Implantable Pulse Generator Implant Date: 20130903
Pulse Gen Serial Number: 66295010

## 2021-01-31 DIAGNOSIS — E538 Deficiency of other specified B group vitamins: Secondary | ICD-10-CM | POA: Diagnosis not present

## 2021-02-07 DIAGNOSIS — E538 Deficiency of other specified B group vitamins: Secondary | ICD-10-CM | POA: Diagnosis not present

## 2021-02-15 DIAGNOSIS — E538 Deficiency of other specified B group vitamins: Secondary | ICD-10-CM | POA: Diagnosis not present

## 2021-02-17 ENCOUNTER — Other Ambulatory Visit: Payer: Self-pay | Admitting: Cardiology

## 2021-02-17 NOTE — Telephone Encounter (Signed)
Prescription refill request for Xarelto received.  Indication: afib  Last office visit: [place], 03/11/2020 Weight: 72.8 kg Age: 87 yo  Scr: 1.44, 03/11/2020 CrCl: 29 ml/min

## 2021-02-18 NOTE — Progress Notes (Signed)
Remote pacemaker transmission.   

## 2021-02-21 DIAGNOSIS — E538 Deficiency of other specified B group vitamins: Secondary | ICD-10-CM | POA: Diagnosis not present

## 2021-03-15 ENCOUNTER — Other Ambulatory Visit: Payer: Self-pay

## 2021-03-15 ENCOUNTER — Ambulatory Visit (INDEPENDENT_AMBULATORY_CARE_PROVIDER_SITE_OTHER): Payer: Medicare Other | Admitting: Cardiology

## 2021-03-15 ENCOUNTER — Encounter: Payer: Self-pay | Admitting: Cardiology

## 2021-03-15 VITALS — BP 104/70 | HR 70 | Ht 61.0 in | Wt 152.0 lb

## 2021-03-15 DIAGNOSIS — I4821 Permanent atrial fibrillation: Secondary | ICD-10-CM

## 2021-03-15 NOTE — Progress Notes (Signed)
Electrophysiology Office Note   Date:  03/15/2021   ID:  Penny Sanders, DOB October 17, 1926, MRN [id number]  PCP:  Harlan Stains, MD  Primary Electrophysiologist:  Fujie Dickison Meredith Leeds, MD    No chief complaint on file.    History of Present Illness: Penny Sanders is a 87 y.o. female who presents today for electrophysiology evaluation.      She has a history of sick sinus syndrome and third-degree AV block.  She is status post Biotronik dual-chamber pacemaker implanted 05/21/2012.  She also has permanent atrial fibrillation.  She is currently on Xarelto.  She had an echo done in 2013 that showed an EF greater than 65% and mild mitral regurgitation.  Today, denies symptoms of palpitations, chest pain, shortness of breath, orthopnea, PND, lower extremity edema, claudication, dizziness, presyncope, syncope, bleeding, or neurologic sequela. The patient is tolerating medications without difficulties.  He has been feeling well.  She has no complaints of chest pain or shortness of breath.  She is able to all of her daily activities.  She is only limited by pain in her neck.  She feels that this is due to arthritis.   Past Medical History:  Diagnosis Date   Hypertension    Pacemaker    Past Surgical History:  Procedure Laterality Date   ABDOMINAL HYSTERECTOMY     APPENDECTOMY     BREAST LUMPECTOMY     CHOLECYSTECTOMY       Current Outpatient Medications  Medication Sig Dispense Refill   amLODipine-benazepril (LOTREL) 5-10 MG per capsule Take 1 capsule by mouth daily.   0   CALCIUM PO Take 1 tablet by mouth daily.     cholecalciferol (VITAMIN D) 1000 UNITS tablet Take 1,000 Units by mouth 2 (two) times daily.     hydroxypropyl methylcellulose / hypromellose (ISOPTO TEARS / GONIOVISC) 2.5 % ophthalmic solution Place 1 drop into both eyes as needed for dry eyes.     multivitamin-lutein (OCUVITE-LUTEIN) CAPS capsule Take 1 capsule by mouth daily.     sodium chloride (OCEAN) 0.65 % SOLN nasal  spray Place 1 spray into both nostrils as needed for congestion.     XARELTO 15 MG TABS tablet TAKE 1 TABLET (15 MG TOTAL) BY MOUTH DAILY WITH SUPPER. 30 tablet 5   No current facility-administered medications for this visit.    Allergies:   Sulfa antibiotics   Social History:  The patient  reports that she has never smoked. She has never used smokeless tobacco. She reports that she does not drink alcohol and does not use drugs.   Family History:  [name]   family history includes Anemia in her mother; Bladder Cancer in her father; Breast cancer in her sister; Cancer in her brother; Hypertension in her mother.   ROS:  Please see the history of present illness.   Otherwise, review of systems is positive for none.   All other systems are reviewed and negative.   PHYSICAL EXAM: VS:  BP 104/70   Pulse 70   Ht 5\' 1"  (1.549 m)   Wt 152 lb (68.9 kg)   SpO2 96%   BMI 28.72 kg/m  , BMI Body mass index is 28.72 kg/m. GEN: Well nourished, well developed, in no acute distress  HEENT: normal  Neck: no JVD, carotid bruits, or masses Cardiac: RRR; no murmurs, rubs, or gallops,no edema  Respiratory:  clear to auscultation bilaterally, normal work of breathing GI: soft, nontender, nondistended, + BS MS: no deformity or atrophy  Skin:  warm and dry, device site well healed Neuro:  Strength and sensation are intact Psych: euthymic mood, full affect  EKG:  EKG is ordered today. Personal review of the ekg ordered shows atrial fibrillation, ventricular paced  Personal review of the device interrogation today. Results in [place]: No results found for requested labs within last 8760 hours.    Lipid Panel  No results found for: CHOL, TRIG, HDL, CHOLHDL, VLDL, LDLCALC, LDLDIRECT   Wt Readings from Last 3 Encounters:  03/15/21 152 lb (68.9 kg)  03/11/20 160 lb 6.4 oz (72.8 kg)  03/11/19 166 lb (75.3 kg)      Other studies Reviewed: Additional studies/ records that  were reviewed today include: cardiology notes   ASSESSMENT AND PLAN:  1.  1.  Permanent atrial fibrillation: Currently on Xarelto.  CHA2DS2-VASc of 4.  His ventricular paced and complete heart block and thus rate control is not an issue.   2.  2.  Complete heart block: Status post Biotronik dual-chamber pacemaker.  Device functioning appropriately.  No changes at this time.   Current medicines are reviewed at length with the patient today.   The patient does not have concerns regarding her medicines.  The following changes were made today: None  Labs/ tests ordered today include:  Orders Placed This Encounter  Procedures   EKG 12-Lead      Disposition:   FU with [facility]ress Newmann 12 months  Signed, Klea Nall Meredith Leeds, MD  03/15/2021 3:17 PM     [place] [address] [phone] (office) [phone] (fax)

## 2021-03-15 NOTE — Patient Instructions (Signed)
Medication Instructions:  Your physician recommends that you continue on your current medications as directed. Please refer to the Current Medication list given to you today.  *If you need a refill on your cardiac medications before your next appointment, please call your pharmacy*   Lab Work: None ordered If you have labs (blood work) drawn today and your tests are completely normal, you will receive your results only by: [place] (if you have MyChart) OR A paper copy in the mail If you have any lab test that is abnormal or we need to change your treatment, we will call you to review the results.   Testing/Procedures: None ordered   Follow-Up: At [facility], you and your health needs are our priority.  As part of our continuing mission to provide you with exceptional heart care, we have created designated Provider Care Teams.  These Care Teams include your primary Cardiologist (physician) and Advanced Practice Providers (APPs -  Physician Assistants and Nurse Practitioners) who all work together to provide you with the care you need, when you need it.  We recommend signing up for the patient portal called "MyChart".  Sign up information is provided on this After Visit Summary.  MyChart is used to connect with patients for Virtual Visits (Telemedicine).  Patients are able to view lab/test results, encounter notes, upcoming appointments, etc.  Non-urgent messages can be sent to your provider as well.   To learn more about what you can do with MyChart, go to [email or web address].    Remote monitoring is used to monitor your Pacemaker or ICD from home. This monitoring reduces the number of office visits required to check your device to one time per year. It allows [place] to keep an eye on the functioning of your device to ensure it is working properly. You are scheduled for a device check from home on 04/28/2021. You may send your transmission at any time that day. If you have a  wireless device, the transmission will be sent automatically. After your physician reviews your transmission, you will receive a postcard with your next transmission date.  Your next appointment:   1 year(s)  The format for your next appointment:   In Person  Provider:   Allegra Lai, MD   Thank you for choosing [facility]!!   Trinidad Curet, RN [phone]    Other Instructions

## 2021-03-24 ENCOUNTER — Telehealth: Payer: Self-pay

## 2021-03-24 NOTE — Telephone Encounter (Signed)
[employer] alert received for disconnected monitor.  Last transmission received 03/01/21.  Attempted to reach pt, no answer/ VM is full not accepting messages.    Spoke with pt son,Allen (DPR on file).  He is going to see patient today and will make sure monitor is plugged in.

## 2021-03-28 DIAGNOSIS — E538 Deficiency of other specified B group vitamins: Secondary | ICD-10-CM | POA: Diagnosis not present

## 2021-03-28 NOTE — Telephone Encounter (Signed)
As of 03/28/21, no transmissions have been received.  Attempted to reach son, Zenia Resides, no answer.  LVM requesting he callback to discuss.    If monitor is plugged in and working then this may be a 3G/ 4G issue.  [employer] will need an address to mail a new monitor to

## 2021-03-31 NOTE — Telephone Encounter (Signed)
Spoke to patients son about monitor and states he has unpluged and pluged back in. Still not connecting. Contacted [employer] rep [employer], states device is older for 4G is not option. Advised to get patient info and he will send her a new monitor. Son made aware and request shipping be to his house since he will set this up for patient.     Penny Sanders  [place], [place] [address]

## 2021-04-25 DIAGNOSIS — E538 Deficiency of other specified B group vitamins: Secondary | ICD-10-CM | POA: Diagnosis not present

## 2021-04-28 ENCOUNTER — Ambulatory Visit (INDEPENDENT_AMBULATORY_CARE_PROVIDER_SITE_OTHER): Payer: Medicare Other

## 2021-04-28 DIAGNOSIS — I442 Atrioventricular block, complete: Secondary | ICD-10-CM

## 2021-04-28 LAB — CUP PACEART REMOTE DEVICE CHECK
Battery Remaining Percentage: 40 %
Brady Statistic RV Percent Paced: 100 %
Date Time Interrogation Session: 20220811065123
Implantable Lead Implant Date: 20130903
Implantable Lead Implant Date: 20130903
Implantable Lead Location: 753859
Implantable Lead Location: 753860
Implantable Lead Model: 4456
Implantable Lead Model: 5076
Implantable Lead Serial Number: 1111
Implantable Pulse Generator Implant Date: 20130903
Lead Channel Impedance Value: 527 Ohm
Lead Channel Pacing Threshold Amplitude: 0.7 V
Lead Channel Pacing Threshold Pulse Width: 0.4 ms
Lead Channel Sensing Intrinsic Amplitude: 16.1 mV
Lead Channel Setting Pacing Amplitude: 2.5 V
Lead Channel Setting Pacing Pulse Width: 0.4 ms
Lead Channel Setting Sensing Sensitivity: 2.5 mV
Pulse Gen Serial Number: 66295010

## 2021-05-19 NOTE — Progress Notes (Signed)
Remote pacemaker transmission.   

## 2021-05-30 DIAGNOSIS — E538 Deficiency of other specified B group vitamins: Secondary | ICD-10-CM | POA: Diagnosis not present

## 2021-06-29 DIAGNOSIS — R7303 Prediabetes: Secondary | ICD-10-CM | POA: Diagnosis not present

## 2021-06-29 DIAGNOSIS — E538 Deficiency of other specified B group vitamins: Secondary | ICD-10-CM | POA: Diagnosis not present

## 2021-06-29 DIAGNOSIS — D6869 Other thrombophilia: Secondary | ICD-10-CM | POA: Diagnosis not present

## 2021-06-29 DIAGNOSIS — N183 Chronic kidney disease, stage 3 unspecified: Secondary | ICD-10-CM | POA: Diagnosis not present

## 2021-06-29 DIAGNOSIS — I4819 Other persistent atrial fibrillation: Secondary | ICD-10-CM | POA: Diagnosis not present

## 2021-06-29 DIAGNOSIS — I129 Hypertensive chronic kidney disease with stage 1 through stage 4 chronic kidney disease, or unspecified chronic kidney disease: Secondary | ICD-10-CM | POA: Diagnosis not present

## 2021-07-28 ENCOUNTER — Ambulatory Visit (INDEPENDENT_AMBULATORY_CARE_PROVIDER_SITE_OTHER): Payer: Medicare Other

## 2021-07-28 DIAGNOSIS — I442 Atrioventricular block, complete: Secondary | ICD-10-CM | POA: Diagnosis not present

## 2021-07-29 LAB — CUP PACEART REMOTE DEVICE CHECK
Date Time Interrogation Session: 20221111092952
Implantable Lead Implant Date: 20130903
Implantable Lead Implant Date: 20130903
Implantable Lead Location: 753859
Implantable Lead Location: 753860
Implantable Lead Model: 4456
Implantable Lead Model: 5076
Implantable Lead Serial Number: 1111
Implantable Pulse Generator Implant Date: 20130903
Pulse Gen Serial Number: 66295010

## 2021-08-05 NOTE — Progress Notes (Signed)
Remote pacemaker transmission.   

## 2021-08-08 DIAGNOSIS — R197 Diarrhea, unspecified: Secondary | ICD-10-CM | POA: Diagnosis not present

## 2021-08-08 DIAGNOSIS — J3489 Other specified disorders of nose and nasal sinuses: Secondary | ICD-10-CM | POA: Diagnosis not present

## 2021-08-08 DIAGNOSIS — J069 Acute upper respiratory infection, unspecified: Secondary | ICD-10-CM | POA: Diagnosis not present

## 2021-08-08 DIAGNOSIS — R059 Cough, unspecified: Secondary | ICD-10-CM | POA: Diagnosis not present

## 2021-08-20 ENCOUNTER — Other Ambulatory Visit: Payer: Self-pay | Admitting: Cardiology

## 2021-08-22 NOTE — Telephone Encounter (Signed)
Prescription refill request for Xarelto received.  Indication: afib  Last office visit: 03/15/2021,  [place] Weight: 68.9 kg Age:  87 yo  Scr: 1.47, 06/29/2021 CrCl: 26 ml/min   Refill sent.

## 2021-10-27 ENCOUNTER — Ambulatory Visit (INDEPENDENT_AMBULATORY_CARE_PROVIDER_SITE_OTHER): Payer: Medicare Other

## 2021-10-27 DIAGNOSIS — I442 Atrioventricular block, complete: Secondary | ICD-10-CM | POA: Diagnosis not present

## 2021-10-27 LAB — CUP PACEART REMOTE DEVICE CHECK
Date Time Interrogation Session: 20230209085005
Implantable Lead Implant Date: 20130903
Implantable Lead Implant Date: 20130903
Implantable Lead Location: 753859
Implantable Lead Location: 753860
Implantable Lead Model: 4456
Implantable Lead Model: 5076
Implantable Lead Serial Number: 1111
Implantable Pulse Generator Implant Date: 20130903
Pulse Gen Serial Number: 66295010

## 2021-11-01 NOTE — Progress Notes (Signed)
Remote pacemaker transmission.   

## 2022-01-05 ENCOUNTER — Telehealth: Payer: Self-pay | Admitting: Cardiology

## 2022-01-05 DIAGNOSIS — I4821 Permanent atrial fibrillation: Secondary | ICD-10-CM

## 2022-01-05 MED ORDER — RIVAROXABAN 15 MG PO TABS: ORAL_TABLET | ORAL | 1 refills | Status: DC

## 2022-01-05 NOTE — Telephone Encounter (Signed)
Prescription refill request for Xarelto received.  ?Indication: Afib  ?Last office visit:03/15/21 ([place]) ?Weight:68.9kg ?Age: 88 ?Scr: 1.47 (06/29/21) ?CrCl: 36.18m/min ? ?Appropriate dose and refill sent to requested pharmacy.  ?

## 2022-01-05 NOTE — Telephone Encounter (Signed)
New Message: ? ? ? ? ?Patient's son just wanted you to know that [facility] will be contacting you for a prescription for patient's Xarelto. This will be her new Pharmacy. ?

## 2022-01-12 DIAGNOSIS — M150 Primary generalized (osteo)arthritis: Secondary | ICD-10-CM | POA: Diagnosis not present

## 2022-01-12 DIAGNOSIS — I129 Hypertensive chronic kidney disease with stage 1 through stage 4 chronic kidney disease, or unspecified chronic kidney disease: Secondary | ICD-10-CM | POA: Diagnosis not present

## 2022-01-12 DIAGNOSIS — R413 Other amnesia: Secondary | ICD-10-CM | POA: Diagnosis not present

## 2022-01-12 DIAGNOSIS — E441 Mild protein-calorie malnutrition: Secondary | ICD-10-CM | POA: Diagnosis not present

## 2022-01-12 DIAGNOSIS — F5101 Primary insomnia: Secondary | ICD-10-CM | POA: Diagnosis not present

## 2022-01-12 DIAGNOSIS — N183 Chronic kidney disease, stage 3 unspecified: Secondary | ICD-10-CM | POA: Diagnosis not present

## 2022-01-12 DIAGNOSIS — I4819 Other persistent atrial fibrillation: Secondary | ICD-10-CM | POA: Diagnosis not present

## 2022-01-12 DIAGNOSIS — Z0000 Encounter for general adult medical examination without abnormal findings: Secondary | ICD-10-CM | POA: Diagnosis not present

## 2022-01-12 DIAGNOSIS — D6869 Other thrombophilia: Secondary | ICD-10-CM | POA: Diagnosis not present

## 2022-01-12 DIAGNOSIS — E538 Deficiency of other specified B group vitamins: Secondary | ICD-10-CM | POA: Diagnosis not present

## 2022-01-12 DIAGNOSIS — Z136 Encounter for screening for cardiovascular disorders: Secondary | ICD-10-CM | POA: Diagnosis not present

## 2022-01-12 DIAGNOSIS — R7303 Prediabetes: Secondary | ICD-10-CM | POA: Diagnosis not present

## 2022-01-12 DIAGNOSIS — Z950 Presence of cardiac pacemaker: Secondary | ICD-10-CM | POA: Diagnosis not present

## 2022-01-26 ENCOUNTER — Ambulatory Visit (INDEPENDENT_AMBULATORY_CARE_PROVIDER_SITE_OTHER): Payer: Medicare Other

## 2022-01-26 DIAGNOSIS — I442 Atrioventricular block, complete: Secondary | ICD-10-CM

## 2022-01-26 LAB — CUP PACEART REMOTE DEVICE CHECK
Battery Remaining Percentage: 35 %
Brady Statistic RV Percent Paced: 100 %
Date Time Interrogation Session: 20230509094010
Implantable Lead Implant Date: 20130903
Implantable Lead Implant Date: 20130903
Implantable Lead Location: 753859
Implantable Lead Location: 753860
Implantable Lead Model: 4456
Implantable Lead Model: 5076
Implantable Lead Serial Number: 1111
Implantable Pulse Generator Implant Date: 20130903
Lead Channel Impedance Value: 488 Ohm
Lead Channel Pacing Threshold Amplitude: 0.7 V
Lead Channel Pacing Threshold Pulse Width: 0.4 ms
Lead Channel Sensing Intrinsic Amplitude: 17 mV
Lead Channel Setting Pacing Amplitude: 2.5 V
Lead Channel Setting Pacing Pulse Width: 0.4 ms
Lead Channel Setting Sensing Sensitivity: 2.5 mV
Pulse Gen Serial Number: 66295010

## 2022-02-02 NOTE — Progress Notes (Signed)
Remote pacemaker transmission.   

## 2022-04-27 ENCOUNTER — Ambulatory Visit (INDEPENDENT_AMBULATORY_CARE_PROVIDER_SITE_OTHER): Payer: Medicare Other

## 2022-04-27 ENCOUNTER — Telehealth: Payer: Self-pay | Admitting: Cardiology

## 2022-04-27 DIAGNOSIS — I442 Atrioventricular block, complete: Secondary | ICD-10-CM

## 2022-04-27 LAB — CUP PACEART REMOTE DEVICE CHECK
Battery Remaining Percentage: 35 %
Brady Statistic RV Percent Paced: 100 %
Date Time Interrogation Session: 20230810055802
Implantable Lead Implant Date: 20130903
Implantable Lead Implant Date: 20130903
Implantable Lead Location: 753859
Implantable Lead Location: 753860
Implantable Lead Model: 4456
Implantable Lead Model: 5076
Implantable Lead Serial Number: 1111
Implantable Pulse Generator Implant Date: 20130903
Lead Channel Impedance Value: 507 Ohm
Lead Channel Pacing Threshold Amplitude: 0.8 V
Lead Channel Pacing Threshold Pulse Width: 0.4 ms
Lead Channel Sensing Intrinsic Amplitude: 17.4 mV
Lead Channel Setting Pacing Amplitude: 2.5 V
Lead Channel Setting Pacing Pulse Width: 0.4 ms
Lead Channel Setting Sensing Sensitivity: 2.5 mV
Pulse Gen Serial Number: 66295010

## 2022-04-27 NOTE — Telephone Encounter (Signed)
Remote transmission received and reviewed. Normal device function.  Patient called and advised. Appreciative of call.

## 2022-04-27 NOTE — Telephone Encounter (Signed)
  1. Has your device fired? No   2. Is you device beeping? No   3. Are you experiencing draining or swelling at device site? No   4. Are you calling to see if we received your device transmission? Yes  5. Have you passed out? No     Please route to Device Clinic Pool  

## 2022-05-23 NOTE — Progress Notes (Signed)
Remote pacemaker transmission.   

## 2022-07-17 DIAGNOSIS — I4819 Other persistent atrial fibrillation: Secondary | ICD-10-CM | POA: Diagnosis not present

## 2022-07-17 DIAGNOSIS — F5101 Primary insomnia: Secondary | ICD-10-CM | POA: Diagnosis not present

## 2022-07-17 DIAGNOSIS — I129 Hypertensive chronic kidney disease with stage 1 through stage 4 chronic kidney disease, or unspecified chronic kidney disease: Secondary | ICD-10-CM | POA: Diagnosis not present

## 2022-07-17 DIAGNOSIS — Z950 Presence of cardiac pacemaker: Secondary | ICD-10-CM | POA: Diagnosis not present

## 2022-07-17 DIAGNOSIS — R7303 Prediabetes: Secondary | ICD-10-CM | POA: Diagnosis not present

## 2022-07-17 DIAGNOSIS — N183 Chronic kidney disease, stage 3 unspecified: Secondary | ICD-10-CM | POA: Diagnosis not present

## 2022-07-17 DIAGNOSIS — D6869 Other thrombophilia: Secondary | ICD-10-CM | POA: Diagnosis not present

## 2022-07-27 ENCOUNTER — Ambulatory Visit (INDEPENDENT_AMBULATORY_CARE_PROVIDER_SITE_OTHER): Payer: Medicare Other

## 2022-07-27 DIAGNOSIS — I442 Atrioventricular block, complete: Secondary | ICD-10-CM | POA: Diagnosis not present

## 2022-07-27 LAB — CUP PACEART REMOTE DEVICE CHECK
Date Time Interrogation Session: 20231109075549
Implantable Lead Connection Status: 753985
Implantable Lead Connection Status: 753985
Implantable Lead Implant Date: 20130903
Implantable Lead Implant Date: 20130903
Implantable Lead Location: 753859
Implantable Lead Location: 753860
Implantable Lead Model: 4456
Implantable Lead Model: 5076
Implantable Lead Serial Number: 1111
Implantable Pulse Generator Implant Date: 20130903
Pulse Gen Serial Number: 66295010

## 2022-08-02 ENCOUNTER — Ambulatory Visit: Payer: Medicare Other | Attending: Cardiology | Admitting: Cardiology

## 2022-08-02 ENCOUNTER — Encounter: Payer: Self-pay | Admitting: Cardiology

## 2022-08-02 VITALS — BP 122/66 | HR 70 | Ht 61.0 in | Wt 142.6 lb

## 2022-08-02 DIAGNOSIS — Z79899 Other long term (current) drug therapy: Secondary | ICD-10-CM | POA: Insufficient documentation

## 2022-08-02 DIAGNOSIS — I4821 Permanent atrial fibrillation: Secondary | ICD-10-CM | POA: Insufficient documentation

## 2022-08-02 DIAGNOSIS — I442 Atrioventricular block, complete: Secondary | ICD-10-CM | POA: Insufficient documentation

## 2022-08-02 DIAGNOSIS — D6869 Other thrombophilia: Secondary | ICD-10-CM | POA: Insufficient documentation

## 2022-08-02 LAB — CUP PACEART INCLINIC DEVICE CHECK
Date Time Interrogation Session: 20231115122055
Implantable Lead Connection Status: 753985
Implantable Lead Connection Status: 753985
Implantable Lead Implant Date: 20130903
Implantable Lead Implant Date: 20130903
Implantable Lead Location: 753859
Implantable Lead Location: 753860
Implantable Lead Model: 4456
Implantable Lead Model: 5076
Implantable Lead Serial Number: 1111
Implantable Pulse Generator Implant Date: 20130903
Lead Channel Impedance Value: 0 Ohm
Lead Channel Impedance Value: 546 Ohm
Lead Channel Impedance Value: 546 Ohm
Lead Channel Pacing Threshold Amplitude: 0.7 V
Lead Channel Pacing Threshold Pulse Width: 0.4 ms
Pulse Gen Serial Number: 66295010

## 2022-08-02 NOTE — Patient Instructions (Signed)
Medication Instructions:  Your physician recommends that you continue on your current medications as directed. Please refer to the Current Medication list given to you today.  *If you need a refill on your cardiac medications before your next appointment, please call your pharmacy*   Lab Work: Xarelto surveillance lab work today: BMET  & CBC  If you have labs (blood work) drawn today and your tests are completely normal, you will receive your results only by: [employer] (if you have MyChart) OR A paper copy in the mail If you have any lab test that is abnormal or we need to change your treatment, we will call you to review the results.   Testing/Procedures: None ordered   Follow-Up: At [facility] you and your health needs are our priority.  As part of our continuing mission to provide you with exceptional heart care, we have created designated Provider Care Teams.  These Care Teams include your primary Cardiologist (physician) and Advanced Practice Providers (APPs -  Physician Assistants and Nurse Practitioners) who all work together to provide you with the care you need, when you need it.  We recommend signing up for the patient portal called "MyChart".  Sign up information is provided on this After Visit Summary.  MyChart is used to connect with patients for Virtual Visits (Telemedicine).  Patients are able to view lab/test results, encounter notes, upcoming appointments, etc.  Non-urgent messages can be sent to your provider as well.   To learn more about what you can do with MyChart, go to [email or web address].    Remote monitoring is used to monitor your Pacemaker or ICD from home. This monitoring reduces the number of office visits required to check your device to one time per year. It allows [place] to keep an eye on the functioning of your device to ensure it is working properly. You are scheduled for a device check from home on 10/26/2022. You may send your transmission at  any time that day. If you have a wireless device, the transmission will be sent automatically. After your physician reviews your transmission, you will receive a postcard with your next transmission date.  Your next appointment:   1 year(s)  The format for your next appointment:   In Person  Provider:   You will see one of the following Advanced Practice Providers on your designated Care Team:   Tommye Standard, [place] Legrand Como "Jonni Sanger" Chalmers Cater, [place]    Thank you for choosing [facility]!!   Trinidad Curet, RN [phone]    Other Instructions  Important Information About [place]

## 2022-08-02 NOTE — Progress Notes (Signed)
Electrophysiology Office Note   Date:  08/02/2022   ID:  Penny Sanders, DOB 1927/04/13, MRN [id number]  PCP:  Penny Stains, MD  Primary Electrophysiologist:  Penny Mccaughan Meredith Leeds, MD    No chief complaint on file.     History of Present Illness: Penny Sanders is a 88 y.o. female who presents today for electrophysiology evaluation.      She has a history significant for sick sinus syndrome and third-degree AV block.  She is status post Biotronik dual-chamber pacemaker implanted 05/21/2012.  She also has permanent atrial fibrillation.  She is on Xarelto.  Today, denies symptoms of palpitations, chest pain, shortness of breath, orthopnea, PND, lower extremity edema, claudication, dizziness, presyncope, syncope, bleeding, or neurologic sequela. The patient is tolerating medications without difficulties.     Past Medical History:  Diagnosis Date   Hypertension    Pacemaker    Past Surgical History:  Procedure Laterality Date   ABDOMINAL HYSTERECTOMY     APPENDECTOMY     BREAST LUMPECTOMY     CHOLECYSTECTOMY       Current Outpatient Medications  Medication Sig Dispense Refill   amLODipine-benazepril (LOTREL) 5-10 MG per capsule Take 1 capsule by mouth daily.   0   CALCIUM PO Take 1 tablet by mouth daily.     cholecalciferol (VITAMIN D) 1000 UNITS tablet Take 1,000 Units by mouth 2 (two) times daily.     hydroxypropyl methylcellulose / hypromellose (ISOPTO TEARS / GONIOVISC) 2.5 % ophthalmic solution Place 1 drop into both eyes as needed for dry eyes.     multivitamin-lutein (OCUVITE-LUTEIN) CAPS capsule Take 1 capsule by mouth daily.     Rivaroxaban (XARELTO) 15 MG TABS tablet TAKE 1 TABLET (15 MG TOTAL) BY MOUTH DAILY WITH SUPPER. 90 tablet 1   sodium chloride (OCEAN) 0.65 % SOLN nasal spray Place 1 spray into both nostrils as needed for congestion.     No current facility-administered medications for this visit.    Allergies:   Sulfa antibiotics   Social History:  The  patient  reports that she has never smoked. She has never used smokeless tobacco. She reports that she does not drink alcohol and does not use drugs.   Family History:  [name]   family history includes Anemia in her mother; Bladder Cancer in her father; Breast cancer in her sister; Cancer in her brother; Hypertension in her mother.   ROS:  Please see the history of present illness.   Otherwise, review of systems is positive for none.   All other systems are reviewed and negative.   PHYSICAL EXAM: VS:  BP 122/66   Pulse 70   Ht '5\' 1"'$  (1.549 m)   Wt 142 lb 9.6 oz (64.7 kg)   SpO2 99%   BMI 26.94 kg/m  , BMI Body mass index is 26.94 kg/m. GEN: Well nourished, well developed, in no acute distress  HEENT: normal  Neck: no JVD, carotid bruits, or masses Cardiac: RRR; no murmurs, rubs, or gallops,no edema  Respiratory:  clear to auscultation bilaterally, normal work of breathing GI: soft, nontender, nondistended, + BS MS: no deformity or atrophy  Skin: warm and dry, device site well healed Neuro:  Strength and sensation are intact Psych: euthymic mood, full affect  EKG:  EKG is ordered today. Personal review of the ekg ordered shows fibrillation, ventricular paced  Personal review of the device interrogation today. Results in [place]: No results found for requested labs within last 365  days.    Lipid Panel  No results found for: "CHOL", "TRIG", "HDL", "CHOLHDL", "VLDL", "LDLCALC", "LDLDIRECT"   Wt Readings from Last 3 Encounters:  08/02/22 142 lb 9.6 oz (64.7 kg)  03/15/21 152 lb (68.9 kg)  03/11/20 160 lb 6.4 oz (72.8 kg)      Other studies Reviewed: Additional studies/ records that were reviewed today include: cardiology notes   ASSESSMENT AND PLAN:  1.  Permanent atrial fibrillation: Currently on Xarelto.  CHA2DS2-VASc of 4.  Ventricular paced and complete heart block.  2.  Complete heart block: Status post Biotronik dual-chamber pacemaker.   Device function appropriately.  No changes at this time.  3.  Secondary hypercoagulable state: Currently on Xarelto for atrial fibrillation as above  Current medicines are reviewed at length with the patient today.   The patient does not have concerns regarding her medicines.  The following changes were made today: none  Labs/ tests ordered today include:  Orders Placed This Encounter  Procedures   Basic metabolic panel   CBC   EKG 12-Lead      Disposition:   FU 12 months  Signed, Jamari Diana Meredith Leeds, MD  08/02/2022 12:22 PM     [facility] [address] [place] [address] [phone] (office) [phone] (fax)

## 2022-08-03 LAB — CBC
Hematocrit: 40 % (ref 34.0–46.6)
Hemoglobin: 12.9 g/dL (ref 11.1–15.9)
MCH: 30.6 pg (ref 26.6–33.0)
MCHC: 32.3 g/dL (ref 31.5–35.7)
MCV: 95 fL (ref 79–97)
Platelets: 248 10*3/uL (ref 150–450)
RBC: 4.21 x10E6/uL (ref 3.77–5.28)
RDW: 13.2 % (ref 11.7–15.4)
WBC: 7 10*3/uL (ref 3.4–10.8)

## 2022-08-03 LAB — BASIC METABOLIC PANEL
BUN/Creatinine Ratio: 23 (ref 12–28)
BUN: 35 mg/dL (ref 10–36)
CO2: 25 mmol/L (ref 20–29)
Calcium: 9.9 mg/dL (ref 8.7–10.3)
Chloride: 102 mmol/L (ref 96–106)
Creatinine, Ser: 1.49 mg/dL — ABNORMAL HIGH (ref 0.57–1.00)
Glucose: 103 mg/dL — ABNORMAL HIGH (ref 70–99)
Potassium: 4.8 mmol/L (ref 3.5–5.2)
Sodium: 139 mmol/L (ref 134–144)
eGFR: 33 mL/min/{1.73_m2} — ABNORMAL LOW (ref >59)

## 2022-08-03 NOTE — Progress Notes (Signed)
Remote pacemaker transmission.   

## 2022-08-16 ENCOUNTER — Other Ambulatory Visit: Payer: Self-pay | Admitting: Cardiology

## 2022-08-16 DIAGNOSIS — I4821 Permanent atrial fibrillation: Secondary | ICD-10-CM

## 2022-08-16 NOTE — Telephone Encounter (Signed)
Prescription refill request for Xarelto received.  Indication: Afib  Last office visit: 08/02/22 (Camnitz)  Weight: 64.7kg Age: 88 Scr: 1.49 (08/02/22) CrCl: 24.68m/min

## 2022-10-25 LAB — CUP PACEART REMOTE DEVICE CHECK
Battery Voltage: 30
Date Time Interrogation Session: 20240207080952
Implantable Lead Connection Status: 753985
Implantable Lead Connection Status: 753985
Implantable Lead Implant Date: 20130903
Implantable Lead Implant Date: 20130903
Implantable Lead Location: 753859
Implantable Lead Location: 753860
Implantable Lead Model: 4456
Implantable Lead Model: 5076
Implantable Lead Serial Number: 1111
Implantable Pulse Generator Implant Date: 20130903
Pulse Gen Serial Number: 66295010

## 2022-10-26 ENCOUNTER — Ambulatory Visit (INDEPENDENT_AMBULATORY_CARE_PROVIDER_SITE_OTHER): Payer: Medicare Other

## 2022-10-26 DIAGNOSIS — I442 Atrioventricular block, complete: Secondary | ICD-10-CM | POA: Diagnosis not present

## 2022-11-15 NOTE — Progress Notes (Signed)
Remote pacemaker transmission.   

## 2022-11-20 DIAGNOSIS — H47323 Drusen of optic disc, bilateral: Secondary | ICD-10-CM | POA: Diagnosis not present

## 2022-11-20 DIAGNOSIS — H353131 Nonexudative age-related macular degeneration, bilateral, early dry stage: Secondary | ICD-10-CM | POA: Diagnosis not present

## 2022-11-20 DIAGNOSIS — Z961 Presence of intraocular lens: Secondary | ICD-10-CM | POA: Diagnosis not present

## 2022-11-20 DIAGNOSIS — H04123 Dry eye syndrome of bilateral lacrimal glands: Secondary | ICD-10-CM | POA: Diagnosis not present

## 2023-01-15 DIAGNOSIS — N183 Chronic kidney disease, stage 3 unspecified: Secondary | ICD-10-CM | POA: Diagnosis not present

## 2023-01-15 DIAGNOSIS — F5101 Primary insomnia: Secondary | ICD-10-CM | POA: Diagnosis not present

## 2023-01-15 DIAGNOSIS — H6123 Impacted cerumen, bilateral: Secondary | ICD-10-CM | POA: Diagnosis not present

## 2023-01-15 DIAGNOSIS — R7303 Prediabetes: Secondary | ICD-10-CM | POA: Diagnosis not present

## 2023-01-15 DIAGNOSIS — Z950 Presence of cardiac pacemaker: Secondary | ICD-10-CM | POA: Diagnosis not present

## 2023-01-15 DIAGNOSIS — D6869 Other thrombophilia: Secondary | ICD-10-CM | POA: Diagnosis not present

## 2023-01-15 DIAGNOSIS — E538 Deficiency of other specified B group vitamins: Secondary | ICD-10-CM | POA: Diagnosis not present

## 2023-01-15 DIAGNOSIS — I4819 Other persistent atrial fibrillation: Secondary | ICD-10-CM | POA: Diagnosis not present

## 2023-01-15 DIAGNOSIS — Z0000 Encounter for general adult medical examination without abnormal findings: Secondary | ICD-10-CM | POA: Diagnosis not present

## 2023-01-15 DIAGNOSIS — R413 Other amnesia: Secondary | ICD-10-CM | POA: Diagnosis not present

## 2023-01-15 DIAGNOSIS — I129 Hypertensive chronic kidney disease with stage 1 through stage 4 chronic kidney disease, or unspecified chronic kidney disease: Secondary | ICD-10-CM | POA: Diagnosis not present

## 2023-01-15 DIAGNOSIS — E2839 Other primary ovarian failure: Secondary | ICD-10-CM | POA: Diagnosis not present

## 2023-01-17 ENCOUNTER — Other Ambulatory Visit: Payer: Self-pay | Admitting: Family Medicine

## 2023-01-17 DIAGNOSIS — E2839 Other primary ovarian failure: Secondary | ICD-10-CM

## 2023-01-25 ENCOUNTER — Ambulatory Visit (INDEPENDENT_AMBULATORY_CARE_PROVIDER_SITE_OTHER): Payer: Medicare Other

## 2023-01-25 DIAGNOSIS — I442 Atrioventricular block, complete: Secondary | ICD-10-CM | POA: Diagnosis not present

## 2023-01-25 LAB — CUP PACEART REMOTE DEVICE CHECK
Battery Voltage: 30
Date Time Interrogation Session: 20240509072016
Implantable Lead Connection Status: 753985
Implantable Lead Connection Status: 753985
Implantable Lead Implant Date: 20130903
Implantable Lead Implant Date: 20130903
Implantable Lead Location: 753859
Implantable Lead Location: 753860
Implantable Lead Model: 4456
Implantable Lead Model: 5076
Implantable Lead Serial Number: 1111
Implantable Pulse Generator Implant Date: 20130903
Pulse Gen Serial Number: 66295010

## 2023-02-13 NOTE — Progress Notes (Signed)
Remote pacemaker transmission.   

## 2023-02-14 ENCOUNTER — Other Ambulatory Visit: Payer: Self-pay | Admitting: Cardiology

## 2023-02-14 DIAGNOSIS — I4821 Permanent atrial fibrillation: Secondary | ICD-10-CM

## 2023-02-14 NOTE — Telephone Encounter (Signed)
Prescription refill request for Xarelto received.  Indication: Afib  Last office visit: 08/02/22 (Camnitz)  Weight: 64.7kg Age: 89 Scr: 1.49 (08/02/22)  CrCl: 24.45ml/min  Appropriate dose. Refill sent.

## 2023-03-12 DIAGNOSIS — H47323 Drusen of optic disc, bilateral: Secondary | ICD-10-CM | POA: Diagnosis not present

## 2023-03-12 DIAGNOSIS — H04123 Dry eye syndrome of bilateral lacrimal glands: Secondary | ICD-10-CM | POA: Diagnosis not present

## 2023-03-12 DIAGNOSIS — Z961 Presence of intraocular lens: Secondary | ICD-10-CM | POA: Diagnosis not present

## 2023-03-12 DIAGNOSIS — H353130 Nonexudative age-related macular degeneration, bilateral, stage unspecified: Secondary | ICD-10-CM | POA: Diagnosis not present

## 2023-03-23 DIAGNOSIS — R310 Gross hematuria: Secondary | ICD-10-CM | POA: Diagnosis not present

## 2023-03-23 DIAGNOSIS — R8290 Unspecified abnormal findings in urine: Secondary | ICD-10-CM | POA: Diagnosis not present

## 2023-04-03 DIAGNOSIS — R351 Nocturia: Secondary | ICD-10-CM | POA: Diagnosis not present

## 2023-04-03 DIAGNOSIS — R310 Gross hematuria: Secondary | ICD-10-CM | POA: Diagnosis not present

## 2023-04-03 DIAGNOSIS — R350 Frequency of micturition: Secondary | ICD-10-CM | POA: Diagnosis not present

## 2023-04-20 DIAGNOSIS — R310 Gross hematuria: Secondary | ICD-10-CM | POA: Diagnosis not present

## 2023-04-20 DIAGNOSIS — N289 Disorder of kidney and ureter, unspecified: Secondary | ICD-10-CM | POA: Diagnosis not present

## 2023-04-20 DIAGNOSIS — K5730 Diverticulosis of large intestine without perforation or abscess without bleeding: Secondary | ICD-10-CM | POA: Diagnosis not present

## 2023-04-26 ENCOUNTER — Ambulatory Visit (INDEPENDENT_AMBULATORY_CARE_PROVIDER_SITE_OTHER): Payer: Medicare Other

## 2023-04-26 DIAGNOSIS — I442 Atrioventricular block, complete: Secondary | ICD-10-CM | POA: Diagnosis not present

## 2023-05-03 DIAGNOSIS — R310 Gross hematuria: Secondary | ICD-10-CM | POA: Diagnosis not present

## 2023-06-02 DIAGNOSIS — R300 Dysuria: Secondary | ICD-10-CM | POA: Diagnosis not present

## 2023-06-12 NOTE — Progress Notes (Signed)
Remote pacemaker transmission.   

## 2023-06-16 DIAGNOSIS — N390 Urinary tract infection, site not specified: Secondary | ICD-10-CM | POA: Diagnosis not present

## 2023-07-11 ENCOUNTER — Other Ambulatory Visit: Payer: Self-pay

## 2023-07-11 ENCOUNTER — Emergency Department (HOSPITAL_COMMUNITY): Payer: Medicare Other

## 2023-07-11 ENCOUNTER — Emergency Department (HOSPITAL_COMMUNITY)
Admission: EM | Admit: 2023-07-11 | Discharge: 2023-07-11 | Disposition: A | Discharge status: Home / Self Care | Payer: Medicare Other | Attending: Emergency Medicine | Admitting: Emergency Medicine

## 2023-07-11 DIAGNOSIS — R000 Tachycardia, unspecified: Secondary | ICD-10-CM | POA: Diagnosis not present

## 2023-07-11 DIAGNOSIS — Z7982 Long term (current) use of aspirin: Secondary | ICD-10-CM | POA: Diagnosis not present

## 2023-07-11 DIAGNOSIS — W19XXXA Unspecified fall, initial encounter: Secondary | ICD-10-CM | POA: Diagnosis not present

## 2023-07-11 DIAGNOSIS — W010XXA Fall on same level from slipping, tripping and stumbling without subsequent striking against object, initial encounter: Secondary | ICD-10-CM | POA: Diagnosis not present

## 2023-07-11 DIAGNOSIS — S0083XA Contusion of other part of head, initial encounter: Secondary | ICD-10-CM | POA: Diagnosis not present

## 2023-07-11 DIAGNOSIS — S0993XA Unspecified injury of face, initial encounter: Secondary | ICD-10-CM | POA: Diagnosis not present

## 2023-07-11 DIAGNOSIS — I959 Hypotension, unspecified: Secondary | ICD-10-CM | POA: Diagnosis not present

## 2023-07-11 DIAGNOSIS — M4802 Spinal stenosis, cervical region: Secondary | ICD-10-CM | POA: Diagnosis not present

## 2023-07-11 DIAGNOSIS — S0990XA Unspecified injury of head, initial encounter: Secondary | ICD-10-CM | POA: Diagnosis not present

## 2023-07-11 DIAGNOSIS — Z79899 Other long term (current) drug therapy: Secondary | ICD-10-CM | POA: Insufficient documentation

## 2023-07-11 DIAGNOSIS — R519 Headache, unspecified: Secondary | ICD-10-CM | POA: Diagnosis not present

## 2023-07-11 DIAGNOSIS — S199XXA Unspecified injury of neck, initial encounter: Secondary | ICD-10-CM | POA: Diagnosis not present

## 2023-07-11 LAB — CBC
HCT: 35.1 % — ABNORMAL LOW (ref 36.0–46.0)
Hemoglobin: 11.3 g/dL — ABNORMAL LOW (ref 12.0–15.0)
MCH: 30.5 pg (ref 26.0–34.0)
MCHC: 32.2 g/dL (ref 30.0–36.0)
MCV: 94.6 fL (ref 80.0–100.0)
Platelets: 217 10*3/uL (ref 150–400)
RBC: 3.71 MIL/uL — ABNORMAL LOW (ref 3.87–5.11)
RDW: 15.4 % (ref 11.5–15.5)
WBC: 7.5 10*3/uL (ref 4.0–10.5)
nRBC: 0 % (ref 0.0–0.2)

## 2023-07-11 LAB — COMPREHENSIVE METABOLIC PANEL
ALT: 14 U/L (ref 0–44)
AST: 21 U/L (ref 15–41)
Albumin: 3.2 g/dL — ABNORMAL LOW (ref 3.5–5.0)
Alkaline Phosphatase: 74 U/L (ref 38–126)
Anion gap: 7 (ref 5–15)
BUN: 35 mg/dL — ABNORMAL HIGH (ref 8–23)
CO2: 26 mmol/L (ref 22–32)
Calcium: 9.1 mg/dL (ref 8.9–10.3)
Chloride: 105 mmol/L (ref 98–111)
Creatinine, Ser: 1.49 mg/dL — ABNORMAL HIGH (ref 0.44–1.00)
GFR, Estimated: 32 mL/min — ABNORMAL LOW (ref >60)
Glucose, Bld: 123 mg/dL — ABNORMAL HIGH (ref 70–99)
Potassium: 4.1 mmol/L (ref 3.5–5.1)
Sodium: 138 mmol/L (ref 135–145)
Total Bilirubin: 0.5 mg/dL (ref 0.3–1.2)
Total Protein: 6.2 g/dL — ABNORMAL LOW (ref 6.5–8.1)

## 2023-07-11 LAB — PROTIME-INR
INR: 1.3 — ABNORMAL HIGH (ref 0.8–1.2)
Prothrombin Time: 16.2 s — ABNORMAL HIGH (ref 11.4–15.2)

## 2023-07-11 NOTE — Progress Notes (Signed)
Orthopedic Tech Progress Note Patient Details:  Urooj Sereno 08-15-1927 [id number]  Patient ID: Milagros Reap, female   DOB: March 25, 1927, 89 y.o.   MRN: [id number] Level II trauma, no needs  Jozalynn Noyce OTR/L 07/11/2023, 2:24 PM

## 2023-07-11 NOTE — ED Provider Notes (Signed)
[place] EMERGENCY DEPARTMENT AT [facility] Provider Note   CSN: [id number] Arrival date & time: 07/11/23  1405     History {Add pertinent medical, surgical, social history, OB history to HPI:1} Chief Complaint  Patient presents with   Fall    Trip and fall at home. No LOC. Hematoma above right eyebrow. On xarelto, has pacemaker. VSS pta.     Penny Sanders is a 89 y.o. female.  HPI Patient presents via EMS after a ground-level fall.  Fall was witnessed as the patient was with a family member.  Reportedly patient fell to the ground, striking her head.  She does have a history of frequent falls as well as dementia.  She seems to answer questions about her current condition appropriately though no depth of history is available. EMS reports the patient struck the right side of her forehead, sustained hematoma.  Patient self denies vision changes, neck pain, chest pain, hip pain.  She also denies broken teeth.    Home Medications Prior to Admission medications   Medication Sig Start Date End Date Taking? Authorizing Provider  amLODipine-benazepril (LOTREL) 5-10 MG per capsule Take 1 capsule by mouth daily.  08/03/14   [provider]  CALCIUM PO Take 1 tablet by mouth daily.    [provider]  cholecalciferol (VITAMIN D) 1000 UNITS tablet Take 1,000 Units by mouth 2 (two) times daily.    [provider]  hydroxypropyl methylcellulose / hypromellose (ISOPTO TEARS / GONIOVISC) 2.5 % ophthalmic solution Place 1 drop into both eyes as needed for dry eyes.    [provider]  multivitamin-lutein (OCUVITE-LUTEIN) CAPS capsule Take 1 capsule by mouth daily.    [provider]  sodium chloride (OCEAN) 0.65 % SOLN nasal spray Place 1 spray into both nostrils as needed for congestion.    [provider]  XARELTO 15 MG TABS tablet TAKE 1 TABLET (15 MG TOTAL) BY MOUTH DAILY WITH SUPPER 02/14/23   Camnitz, Andree Coss, MD       Allergies    Sulfa antibiotics    Review of Systems   Review of Systems  Physical Exam Updated Vital Signs BP (!) 145/69   Temp 99.4 F (37.4 C) (Oral)   Resp 20   Wt 64 kg   BMI 26.64 kg/m  Physical Exam Vitals and nursing note reviewed.  Constitutional:      General: She is not in acute distress.    Appearance: She is well-developed.  HENT:     Head: Normocephalic and atraumatic.   Eyes:     Conjunctiva/sclera: Conjunctivae normal.  Cardiovascular:     Rate and Rhythm: Normal rate and regular rhythm.  Pulmonary:     Effort: Pulmonary effort is normal. No respiratory distress.     Breath sounds: Normal breath sounds. No stridor.  Abdominal:     General: There is no distension.  Musculoskeletal:     Comments: Pelvis stable, extremities unremarkable, patient flexes each hip appropriately spontaneously to command.  Skin:    General: Skin is warm and dry.  Neurological:     Mental Status: She is alert and oriented to person, place, and time.     Cranial Nerves: No cranial nerve deficit.  Psychiatric:        Mood and Affect: Mood normal.     ED Results / Procedures / Treatments   Labs (all labs ordered are listed, but only abnormal results are displayed) Labs Reviewed  COMPREHENSIVE METABOLIC PANEL - Abnormal;  Notable for the following components:      Result Value   Glucose, Bld 123 (*)    BUN 35 (*)    Creatinine, Ser 1.49 (*)    Total Protein 6.2 (*)    Albumin 3.2 (*)    GFR, Estimated 32 (*)    All other components within normal limits  CBC - Abnormal; Notable for the following components:   RBC 3.71 (*)    Hemoglobin 11.3 (*)    HCT 35.1 (*)    All other components within normal limits  PROTIME-INR - Abnormal; Notable for the following components:   Prothrombin Time 16.2 (*)    INR 1.3 (*)    All other components within normal limits    EKG None  Radiology No results found.  Procedures Procedures  {Document cardiac monitor, telemetry  assessment procedure when appropriate:1}  Medications Ordered in ED Medications - No data to display  ED Course/ Medical Decision Making/ A&P   {   Click here for ABCD2, HEART and other calculatorsREFRESH Note before signing :1}                              Medical Decision Making Elderly female presents as a level 2 trauma due to use of anticoagulation and a fall with head trauma.  Patient is awake, alert, oriented at baseline has reassuring initial vital signs, but obvious facial trauma. Head CT, labs ordered.  Amount and/or Complexity of Data Reviewed Independent Historian: EMS Labs: ordered. Decision-making details documented in ED Course. Radiology: ordered and independent interpretation performed. Decision-making details documented in ED Course.  Risk Prescription drug management. Decision regarding hospitalization. Diagnosis or treatment significantly limited by social determinants of health.   ***  {Document critical care time when appropriate:1} {Document review of labs and clinical decision tools ie heart score, Chads2Vasc2 etc:1}  {Document your independent review of radiology images, and any outside records:1} {Document your discussion with family members, caretakers, and with consultants:1} {Document social determinants of health affecting pt's care:1} {Document your decision making why or why not admission, treatments were needed:1} Final Clinical Impression(s) / ED Diagnoses Final diagnoses:  None    Rx / DC Orders ED Discharge Orders     None

## 2023-07-11 NOTE — ED Notes (Signed)
Pt to ct scan.

## 2023-07-11 NOTE — ED Notes (Signed)
Pt has returned from ct 

## 2023-07-11 NOTE — ED Provider Notes (Signed)
Signout from Dr. Jeraldine Loots.  89 year old female level 2 trauma ground fall on anticoagulation.  Plan is to follow-up on imaging.  If no acute findings likely can be discharged Physical Exam  BP (!) 145/69   Temp 99.4 F (37.4 C) (Oral)   Resp 20   Wt 64 kg   BMI 26.64 kg/m   Physical Exam  Procedures  Procedures  ED Course / MDM    Medical Decision Making Amount and/or Complexity of Data Reviewed Labs: ordered. Radiology: ordered.   CT showing hematoma otherwise no acute traumatic findings.  Reviewed this with family.  Will remove cervical collar and discharge, close follow-up with PCP.       Terrilee Files, MD 07/12/23 [phone]

## 2023-07-16 ENCOUNTER — Ambulatory Visit
Admission: RE | Admit: 2023-07-16 | Discharge: 2023-07-16 | Disposition: A | Discharge status: Home / Self Care | Payer: Medicare Other | Source: Ambulatory Visit | Attending: Family Medicine | Admitting: Family Medicine

## 2023-07-16 ENCOUNTER — Other Ambulatory Visit: Payer: Self-pay | Admitting: Family Medicine

## 2023-07-16 DIAGNOSIS — I129 Hypertensive chronic kidney disease with stage 1 through stage 4 chronic kidney disease, or unspecified chronic kidney disease: Secondary | ICD-10-CM | POA: Diagnosis not present

## 2023-07-16 DIAGNOSIS — D6869 Other thrombophilia: Secondary | ICD-10-CM | POA: Diagnosis not present

## 2023-07-16 DIAGNOSIS — M25551 Pain in right hip: Secondary | ICD-10-CM

## 2023-07-16 DIAGNOSIS — W19XXXD Unspecified fall, subsequent encounter: Secondary | ICD-10-CM | POA: Diagnosis not present

## 2023-07-16 DIAGNOSIS — N183 Chronic kidney disease, stage 3 unspecified: Secondary | ICD-10-CM | POA: Diagnosis not present

## 2023-07-16 DIAGNOSIS — Z950 Presence of cardiac pacemaker: Secondary | ICD-10-CM | POA: Diagnosis not present

## 2023-07-16 DIAGNOSIS — I4819 Other persistent atrial fibrillation: Secondary | ICD-10-CM | POA: Diagnosis not present

## 2023-07-16 DIAGNOSIS — R300 Dysuria: Secondary | ICD-10-CM | POA: Diagnosis not present

## 2023-07-16 DIAGNOSIS — N3090 Cystitis, unspecified without hematuria: Secondary | ICD-10-CM | POA: Diagnosis not present

## 2023-07-16 DIAGNOSIS — R413 Other amnesia: Secondary | ICD-10-CM | POA: Diagnosis not present

## 2023-07-18 DIAGNOSIS — N183 Chronic kidney disease, stage 3 unspecified: Secondary | ICD-10-CM | POA: Diagnosis not present

## 2023-07-18 DIAGNOSIS — I495 Sick sinus syndrome: Secondary | ICD-10-CM | POA: Diagnosis not present

## 2023-07-18 DIAGNOSIS — Z556 Problems related to health literacy: Secondary | ICD-10-CM | POA: Diagnosis not present

## 2023-07-18 DIAGNOSIS — E1122 Type 2 diabetes mellitus with diabetic chronic kidney disease: Secondary | ICD-10-CM | POA: Diagnosis not present

## 2023-07-18 DIAGNOSIS — Z950 Presence of cardiac pacemaker: Secondary | ICD-10-CM | POA: Diagnosis not present

## 2023-07-18 DIAGNOSIS — N3090 Cystitis, unspecified without hematuria: Secondary | ICD-10-CM | POA: Diagnosis not present

## 2023-07-18 DIAGNOSIS — R300 Dysuria: Secondary | ICD-10-CM | POA: Diagnosis not present

## 2023-07-18 DIAGNOSIS — R413 Other amnesia: Secondary | ICD-10-CM | POA: Diagnosis not present

## 2023-07-18 DIAGNOSIS — M1811 Unilateral primary osteoarthritis of first carpometacarpal joint, right hand: Secondary | ICD-10-CM | POA: Diagnosis not present

## 2023-07-18 DIAGNOSIS — Z7901 Long term (current) use of anticoagulants: Secondary | ICD-10-CM | POA: Diagnosis not present

## 2023-07-18 DIAGNOSIS — M25551 Pain in right hip: Secondary | ICD-10-CM | POA: Diagnosis not present

## 2023-07-18 DIAGNOSIS — Z79899 Other long term (current) drug therapy: Secondary | ICD-10-CM | POA: Diagnosis not present

## 2023-07-18 DIAGNOSIS — I129 Hypertensive chronic kidney disease with stage 1 through stage 4 chronic kidney disease, or unspecified chronic kidney disease: Secondary | ICD-10-CM | POA: Diagnosis not present

## 2023-07-18 DIAGNOSIS — E538 Deficiency of other specified B group vitamins: Secondary | ICD-10-CM | POA: Diagnosis not present

## 2023-07-18 DIAGNOSIS — D6869 Other thrombophilia: Secondary | ICD-10-CM | POA: Diagnosis not present

## 2023-07-18 DIAGNOSIS — I447 Left bundle-branch block, unspecified: Secondary | ICD-10-CM | POA: Diagnosis not present

## 2023-07-18 DIAGNOSIS — Z860100 Personal history of colon polyps, unspecified: Secondary | ICD-10-CM | POA: Diagnosis not present

## 2023-07-18 DIAGNOSIS — I4819 Other persistent atrial fibrillation: Secondary | ICD-10-CM | POA: Diagnosis not present

## 2023-07-18 DIAGNOSIS — G4700 Insomnia, unspecified: Secondary | ICD-10-CM | POA: Diagnosis not present

## 2023-07-24 DIAGNOSIS — N183 Chronic kidney disease, stage 3 unspecified: Secondary | ICD-10-CM | POA: Diagnosis not present

## 2023-07-24 DIAGNOSIS — E1122 Type 2 diabetes mellitus with diabetic chronic kidney disease: Secondary | ICD-10-CM | POA: Diagnosis not present

## 2023-07-24 DIAGNOSIS — I129 Hypertensive chronic kidney disease with stage 1 through stage 4 chronic kidney disease, or unspecified chronic kidney disease: Secondary | ICD-10-CM | POA: Diagnosis not present

## 2023-07-24 DIAGNOSIS — I4819 Other persistent atrial fibrillation: Secondary | ICD-10-CM | POA: Diagnosis not present

## 2023-07-24 DIAGNOSIS — I495 Sick sinus syndrome: Secondary | ICD-10-CM | POA: Diagnosis not present

## 2023-07-24 DIAGNOSIS — D6869 Other thrombophilia: Secondary | ICD-10-CM | POA: Diagnosis not present

## 2023-07-26 ENCOUNTER — Ambulatory Visit (INDEPENDENT_AMBULATORY_CARE_PROVIDER_SITE_OTHER): Payer: Medicare Other

## 2023-07-26 DIAGNOSIS — I129 Hypertensive chronic kidney disease with stage 1 through stage 4 chronic kidney disease, or unspecified chronic kidney disease: Secondary | ICD-10-CM | POA: Diagnosis not present

## 2023-07-26 DIAGNOSIS — I495 Sick sinus syndrome: Secondary | ICD-10-CM | POA: Diagnosis not present

## 2023-07-26 DIAGNOSIS — I442 Atrioventricular block, complete: Secondary | ICD-10-CM

## 2023-07-26 DIAGNOSIS — D6869 Other thrombophilia: Secondary | ICD-10-CM | POA: Diagnosis not present

## 2023-07-26 DIAGNOSIS — E1122 Type 2 diabetes mellitus with diabetic chronic kidney disease: Secondary | ICD-10-CM | POA: Diagnosis not present

## 2023-07-26 DIAGNOSIS — N183 Chronic kidney disease, stage 3 unspecified: Secondary | ICD-10-CM | POA: Diagnosis not present

## 2023-07-26 DIAGNOSIS — I4819 Other persistent atrial fibrillation: Secondary | ICD-10-CM | POA: Diagnosis not present

## 2023-07-26 LAB — CUP PACEART REMOTE DEVICE CHECK
Battery Voltage: 25
Date Time Interrogation Session: 20241107094345
Implantable Lead Connection Status: 753985
Implantable Lead Connection Status: 753985
Implantable Lead Implant Date: 20130903
Implantable Lead Implant Date: 20130903
Implantable Lead Location: 753859
Implantable Lead Location: 753860
Implantable Lead Model: 4456
Implantable Lead Model: 5076
Implantable Lead Serial Number: 1111
Implantable Pulse Generator Implant Date: 20130903
Pulse Gen Serial Number: 66295010

## 2023-07-27 DIAGNOSIS — R3915 Urgency of urination: Secondary | ICD-10-CM | POA: Diagnosis not present

## 2023-07-31 DIAGNOSIS — N183 Chronic kidney disease, stage 3 unspecified: Secondary | ICD-10-CM | POA: Diagnosis not present

## 2023-07-31 DIAGNOSIS — I129 Hypertensive chronic kidney disease with stage 1 through stage 4 chronic kidney disease, or unspecified chronic kidney disease: Secondary | ICD-10-CM | POA: Diagnosis not present

## 2023-07-31 DIAGNOSIS — I495 Sick sinus syndrome: Secondary | ICD-10-CM | POA: Diagnosis not present

## 2023-07-31 DIAGNOSIS — E1122 Type 2 diabetes mellitus with diabetic chronic kidney disease: Secondary | ICD-10-CM | POA: Diagnosis not present

## 2023-07-31 DIAGNOSIS — D6869 Other thrombophilia: Secondary | ICD-10-CM | POA: Diagnosis not present

## 2023-07-31 DIAGNOSIS — I4819 Other persistent atrial fibrillation: Secondary | ICD-10-CM | POA: Diagnosis not present

## 2023-08-02 DIAGNOSIS — I4819 Other persistent atrial fibrillation: Secondary | ICD-10-CM | POA: Diagnosis not present

## 2023-08-02 DIAGNOSIS — D6869 Other thrombophilia: Secondary | ICD-10-CM | POA: Diagnosis not present

## 2023-08-02 DIAGNOSIS — I495 Sick sinus syndrome: Secondary | ICD-10-CM | POA: Diagnosis not present

## 2023-08-02 DIAGNOSIS — E1122 Type 2 diabetes mellitus with diabetic chronic kidney disease: Secondary | ICD-10-CM | POA: Diagnosis not present

## 2023-08-02 DIAGNOSIS — N183 Chronic kidney disease, stage 3 unspecified: Secondary | ICD-10-CM | POA: Diagnosis not present

## 2023-08-02 DIAGNOSIS — I129 Hypertensive chronic kidney disease with stage 1 through stage 4 chronic kidney disease, or unspecified chronic kidney disease: Secondary | ICD-10-CM | POA: Diagnosis not present

## 2023-08-03 DIAGNOSIS — D6869 Other thrombophilia: Secondary | ICD-10-CM | POA: Diagnosis not present

## 2023-08-03 DIAGNOSIS — I495 Sick sinus syndrome: Secondary | ICD-10-CM | POA: Diagnosis not present

## 2023-08-03 DIAGNOSIS — E1122 Type 2 diabetes mellitus with diabetic chronic kidney disease: Secondary | ICD-10-CM | POA: Diagnosis not present

## 2023-08-03 DIAGNOSIS — I4819 Other persistent atrial fibrillation: Secondary | ICD-10-CM | POA: Diagnosis not present

## 2023-08-03 DIAGNOSIS — I129 Hypertensive chronic kidney disease with stage 1 through stage 4 chronic kidney disease, or unspecified chronic kidney disease: Secondary | ICD-10-CM | POA: Diagnosis not present

## 2023-08-03 DIAGNOSIS — N183 Chronic kidney disease, stage 3 unspecified: Secondary | ICD-10-CM | POA: Diagnosis not present

## 2023-08-06 ENCOUNTER — Encounter: Payer: Self-pay | Admitting: Cardiology

## 2023-08-06 ENCOUNTER — Other Ambulatory Visit: Payer: Self-pay | Admitting: Family Medicine

## 2023-08-06 ENCOUNTER — Ambulatory Visit: Payer: Medicare Other | Attending: Cardiology | Admitting: Cardiology

## 2023-08-06 VITALS — BP 124/84 | HR 68 | Ht 61.0 in | Wt 150.2 lb

## 2023-08-06 DIAGNOSIS — I442 Atrioventricular block, complete: Secondary | ICD-10-CM

## 2023-08-06 DIAGNOSIS — E2839 Other primary ovarian failure: Secondary | ICD-10-CM

## 2023-08-06 DIAGNOSIS — I4821 Permanent atrial fibrillation: Secondary | ICD-10-CM

## 2023-08-06 LAB — CUP PACEART INCLINIC DEVICE CHECK
Date Time Interrogation Session: 20241118162921
Implantable Lead Connection Status: 753985
Implantable Lead Connection Status: 753985
Implantable Lead Implant Date: 20130903
Implantable Lead Implant Date: 20130903
Implantable Lead Location: 753859
Implantable Lead Location: 753860
Implantable Lead Model: 4456
Implantable Lead Model: 5076
Implantable Lead Serial Number: 1111
Implantable Pulse Generator Implant Date: 20130903
Pulse Gen Serial Number: 66295010

## 2023-08-06 NOTE — Progress Notes (Signed)
  Electrophysiology Office Note:   Date:  08/06/2023  ID:  Milagros Reap, DOB 1927/02/23, MRN [id number]  Primary Cardiologist: None Electrophysiologist: Maximiano Lott Jorja Loa, MD      History of Present Illness:   Sasha Wilds is a 89 y.o. female with h/o atrial fibrillation, sick sinus syndrome with third-degree AV block seen today for routine electrophysiology followup.   Since last being seen in our clinic the patient reports doing.  She is not aware of cardiac issues.  She has been having falls which sound mechanical..  she denies chest pain, palpitations, dyspnea, PND, orthopnea, nausea, vomiting, dizziness, syncope, edema, weight gain, or early satiety.   Review of systems complete and found to be negative unless listed in HPI.      EP Information / Studies Reviewed:    EKG is ordered today. Personal review as below.  EKG Interpretation Date/Time:  Monday August 06 2023 16:08:02 EST Ventricular Rate:  68 PR Interval:    QRS Duration:  160 QT Interval:  480 QTC Calculation: 510 R Axis:   -84  Text Interpretation: Sinus rhythm with complete heart block and Ventricular-paced rhythm No previous ECGs available Confirmed by Horst Ostermiller ([id number]) on 08/06/2023 4:09:52 PM   PPM Interrogation-  reviewed in detail today,  See PACEART report.  Device History: Biotronik Dual Chamber PPM implanted 05/21/12 for CHB  Risk Assessment/Calculations:    CHA2DS2-VASc Score = 4   This indicates a 4.8% annual risk of stroke. [name] score is based upon: CHF History: 0 HTN History: 1 Diabetes History: 0 Stroke History: 0 Vascular Disease History: 0 Age Score: 2 Gender Score: 1             Physical Exam:   VS:  BP 124/84 (BP Location: Left Arm, Patient Position: Sitting, Cuff Size: Normal)   Pulse 68   Ht 5\' 1"  (1.549 m)   Wt 150 lb 3.2 oz (68.1 kg)   SpO2 97%   BMI 28.38 kg/m    Wt Readings from Last 3 Encounters:  08/06/23 150 lb 3.2 oz (68.1 kg)  07/11/23 141 lb  (64 kg)  08/02/22 142 lb 9.6 oz (64.7 kg)     GEN: Well nourished, well developed in no acute distress NECK: No JVD; No carotid bruits CARDIAC: Regular rate and rhythm, no murmurs, rubs, gallops RESPIRATORY:  Clear to auscultation without rales, wheezing or rhonchi  ABDOMEN: Soft, non-tender, non-distended EXTREMITIES:  No edema; No deformity   ASSESSMENT AND PLAN:    CHB s/p Biotronik PPM  Normal PPM function See Pace Art report No changes today  2.  Permanent atrial fibrillation: Complete heart block and ventricular pacing  3.  Secondary hypercoagulable state: Currently on Xarelto for atrial fibrillation  Disposition:   Follow up with EP APP in 12 months  Signed, Brittannie Tawney Jorja Loa, MD

## 2023-08-07 ENCOUNTER — Inpatient Hospital Stay: Admission: RE | Admit: 2023-08-07 | Payer: Medicare Other | Source: Ambulatory Visit

## 2023-08-07 NOTE — Progress Notes (Signed)
Remote pacemaker transmission.   

## 2023-08-14 DIAGNOSIS — N390 Urinary tract infection, site not specified: Secondary | ICD-10-CM | POA: Diagnosis not present

## 2023-09-06 DIAGNOSIS — R3915 Urgency of urination: Secondary | ICD-10-CM | POA: Diagnosis not present

## 2023-09-07 ENCOUNTER — Other Ambulatory Visit: Payer: Self-pay | Admitting: Cardiology

## 2023-09-07 DIAGNOSIS — I4821 Permanent atrial fibrillation: Secondary | ICD-10-CM

## 2023-09-07 NOTE — Telephone Encounter (Signed)
Xarelto 15mg  refill request received. Pt is 89 years old, weight-68.1kg, Crea-1.49 on10/23/24, last seen by Dr. Elberta Fortis on 08/06/23, Diagnosis-Afib, CrCl-24.82 mL/min; Dose is appropriate based on dosing criteria. Will send in refill to requested pharmacy.

## 2023-10-24 DIAGNOSIS — N3021 Other chronic cystitis with hematuria: Secondary | ICD-10-CM | POA: Diagnosis not present

## 2023-10-24 DIAGNOSIS — R3121 Asymptomatic microscopic hematuria: Secondary | ICD-10-CM | POA: Diagnosis not present

## 2024-01-14 DIAGNOSIS — I129 Hypertensive chronic kidney disease with stage 1 through stage 4 chronic kidney disease, or unspecified chronic kidney disease: Secondary | ICD-10-CM | POA: Diagnosis not present

## 2024-01-14 DIAGNOSIS — R296 Repeated falls: Secondary | ICD-10-CM | POA: Diagnosis not present

## 2024-01-14 DIAGNOSIS — F03A18 Unspecified dementia, mild, with other behavioral disturbance: Secondary | ICD-10-CM | POA: Diagnosis not present

## 2024-01-14 DIAGNOSIS — R7303 Prediabetes: Secondary | ICD-10-CM | POA: Diagnosis not present

## 2024-01-14 DIAGNOSIS — I4819 Other persistent atrial fibrillation: Secondary | ICD-10-CM | POA: Diagnosis not present

## 2024-01-14 DIAGNOSIS — E538 Deficiency of other specified B group vitamins: Secondary | ICD-10-CM | POA: Diagnosis not present

## 2024-01-14 DIAGNOSIS — Z950 Presence of cardiac pacemaker: Secondary | ICD-10-CM | POA: Diagnosis not present

## 2024-01-14 DIAGNOSIS — N183 Chronic kidney disease, stage 3 unspecified: Secondary | ICD-10-CM | POA: Diagnosis not present

## 2024-01-24 ENCOUNTER — Ambulatory Visit (INDEPENDENT_AMBULATORY_CARE_PROVIDER_SITE_OTHER): Payer: Medicare Other

## 2024-01-24 DIAGNOSIS — I442 Atrioventricular block, complete: Secondary | ICD-10-CM | POA: Diagnosis not present

## 2024-01-24 LAB — CUP PACEART REMOTE DEVICE CHECK
Battery Voltage: 25
Date Time Interrogation Session: 20250508084627
Implantable Lead Connection Status: 753985
Implantable Lead Connection Status: 753985
Implantable Lead Implant Date: 20130903
Implantable Lead Implant Date: 20130903
Implantable Lead Location: 753859
Implantable Lead Location: 753860
Implantable Lead Model: 4456
Implantable Lead Model: 5076
Implantable Lead Serial Number: 1111
Implantable Pulse Generator Implant Date: 20130903
Pulse Gen Serial Number: 66295010

## 2024-01-25 DIAGNOSIS — R351 Nocturia: Secondary | ICD-10-CM | POA: Diagnosis not present

## 2024-01-25 DIAGNOSIS — N3021 Other chronic cystitis with hematuria: Secondary | ICD-10-CM | POA: Diagnosis not present

## 2024-02-03 ENCOUNTER — Ambulatory Visit: Payer: Self-pay | Admitting: Cardiology

## 2024-02-29 NOTE — Progress Notes (Signed)
 Remote pacemaker transmission.

## 2024-03-04 ENCOUNTER — Other Ambulatory Visit: Payer: Self-pay | Admitting: Cardiology

## 2024-03-04 DIAGNOSIS — I4821 Permanent atrial fibrillation: Secondary | ICD-10-CM

## 2024-03-04 NOTE — Telephone Encounter (Signed)
 Prescription refill request for Xarelto  received.  Indication:afib Last office visit:11/24 Weight:68.1  kg Age:89 Scr:1.49  10/24 CrCl:24.82  ml/min  Prescription refilled

## 2024-03-11 DIAGNOSIS — F02A0 Dementia in other diseases classified elsewhere, mild, without behavioral disturbance, psychotic disturbance, mood disturbance, and anxiety: Secondary | ICD-10-CM | POA: Diagnosis not present

## 2024-03-18 DIAGNOSIS — F02A0 Dementia in other diseases classified elsewhere, mild, without behavioral disturbance, psychotic disturbance, mood disturbance, and anxiety: Secondary | ICD-10-CM | POA: Diagnosis not present

## 2024-03-27 ENCOUNTER — Other Ambulatory Visit: Payer: Medicare Other

## 2024-04-18 DIAGNOSIS — F02A0 Dementia in other diseases classified elsewhere, mild, without behavioral disturbance, psychotic disturbance, mood disturbance, and anxiety: Secondary | ICD-10-CM | POA: Diagnosis not present

## 2024-04-24 ENCOUNTER — Ambulatory Visit: Payer: Self-pay | Admitting: Cardiology

## 2024-04-24 ENCOUNTER — Ambulatory Visit: Payer: Medicare Other

## 2024-04-24 DIAGNOSIS — I4821 Permanent atrial fibrillation: Secondary | ICD-10-CM

## 2024-04-24 LAB — CUP PACEART REMOTE DEVICE CHECK
Date Time Interrogation Session: 20250807120430
Implantable Lead Connection Status: 753985
Implantable Lead Connection Status: 753985
Implantable Lead Implant Date: 20130903
Implantable Lead Implant Date: 20130903
Implantable Lead Location: 753859
Implantable Lead Location: 753860
Implantable Lead Model: 4456
Implantable Lead Model: 5076
Implantable Lead Serial Number: 1111
Implantable Pulse Generator Implant Date: 20130903
Pulse Gen Serial Number: 66295010

## 2024-05-19 DIAGNOSIS — F02A0 Dementia in other diseases classified elsewhere, mild, without behavioral disturbance, psychotic disturbance, mood disturbance, and anxiety: Secondary | ICD-10-CM | POA: Diagnosis not present

## 2024-06-14 NOTE — Progress Notes (Signed)
 Remote PPM Transmission

## 2024-06-17 DIAGNOSIS — R399 Unspecified symptoms and signs involving the genitourinary system: Secondary | ICD-10-CM | POA: Diagnosis not present

## 2024-06-17 DIAGNOSIS — N390 Urinary tract infection, site not specified: Secondary | ICD-10-CM | POA: Diagnosis not present

## 2024-06-18 DIAGNOSIS — F02A0 Dementia in other diseases classified elsewhere, mild, without behavioral disturbance, psychotic disturbance, mood disturbance, and anxiety: Secondary | ICD-10-CM | POA: Diagnosis not present

## 2024-07-09 DIAGNOSIS — Z0000 Encounter for general adult medical examination without abnormal findings: Secondary | ICD-10-CM | POA: Diagnosis not present

## 2024-07-09 DIAGNOSIS — F03B0 Unspecified dementia, moderate, without behavioral disturbance, psychotic disturbance, mood disturbance, and anxiety: Secondary | ICD-10-CM | POA: Diagnosis not present

## 2024-07-09 DIAGNOSIS — N183 Chronic kidney disease, stage 3 unspecified: Secondary | ICD-10-CM | POA: Diagnosis not present

## 2024-07-09 DIAGNOSIS — R7303 Prediabetes: Secondary | ICD-10-CM | POA: Diagnosis not present

## 2024-07-09 DIAGNOSIS — E2839 Other primary ovarian failure: Secondary | ICD-10-CM | POA: Diagnosis not present

## 2024-07-09 DIAGNOSIS — N390 Urinary tract infection, site not specified: Secondary | ICD-10-CM | POA: Diagnosis not present

## 2024-07-09 DIAGNOSIS — I4819 Other persistent atrial fibrillation: Secondary | ICD-10-CM | POA: Diagnosis not present

## 2024-07-09 DIAGNOSIS — Z950 Presence of cardiac pacemaker: Secondary | ICD-10-CM | POA: Diagnosis not present

## 2024-07-09 DIAGNOSIS — E538 Deficiency of other specified B group vitamins: Secondary | ICD-10-CM | POA: Diagnosis not present

## 2024-07-09 DIAGNOSIS — Z23 Encounter for immunization: Secondary | ICD-10-CM | POA: Diagnosis not present

## 2024-07-09 DIAGNOSIS — I129 Hypertensive chronic kidney disease with stage 1 through stage 4 chronic kidney disease, or unspecified chronic kidney disease: Secondary | ICD-10-CM | POA: Diagnosis not present

## 2024-07-09 DIAGNOSIS — L219 Seborrheic dermatitis, unspecified: Secondary | ICD-10-CM | POA: Diagnosis not present

## 2024-07-10 ENCOUNTER — Emergency Department (HOSPITAL_COMMUNITY)

## 2024-07-10 ENCOUNTER — Emergency Department (HOSPITAL_COMMUNITY)
Admission: EM | Admit: 2024-07-10 | Discharge: 2024-07-10 | Disposition: A | Discharge status: Home / Self Care | Attending: Emergency Medicine | Admitting: Emergency Medicine

## 2024-07-10 DIAGNOSIS — W19XXXA Unspecified fall, initial encounter: Secondary | ICD-10-CM | POA: Diagnosis not present

## 2024-07-10 DIAGNOSIS — S0083XA Contusion of other part of head, initial encounter: Secondary | ICD-10-CM | POA: Diagnosis not present

## 2024-07-10 DIAGNOSIS — R519 Headache, unspecified: Secondary | ICD-10-CM | POA: Diagnosis not present

## 2024-07-10 DIAGNOSIS — W1830XA Fall on same level, unspecified, initial encounter: Secondary | ICD-10-CM | POA: Insufficient documentation

## 2024-07-10 DIAGNOSIS — M542 Cervicalgia: Secondary | ICD-10-CM | POA: Diagnosis not present

## 2024-07-10 DIAGNOSIS — G4489 Other headache syndrome: Secondary | ICD-10-CM | POA: Diagnosis not present

## 2024-07-10 DIAGNOSIS — S0990XA Unspecified injury of head, initial encounter: Secondary | ICD-10-CM | POA: Diagnosis not present

## 2024-07-10 DIAGNOSIS — S4992XA Unspecified injury of left shoulder and upper arm, initial encounter: Secondary | ICD-10-CM | POA: Diagnosis not present

## 2024-07-10 DIAGNOSIS — Z95810 Presence of automatic (implantable) cardiac defibrillator: Secondary | ICD-10-CM | POA: Diagnosis not present

## 2024-07-10 DIAGNOSIS — F0390 Unspecified dementia without behavioral disturbance: Secondary | ICD-10-CM | POA: Insufficient documentation

## 2024-07-10 DIAGNOSIS — Z950 Presence of cardiac pacemaker: Secondary | ICD-10-CM | POA: Diagnosis not present

## 2024-07-10 DIAGNOSIS — I517 Cardiomegaly: Secondary | ICD-10-CM | POA: Diagnosis not present

## 2024-07-10 DIAGNOSIS — Z7901 Long term (current) use of anticoagulants: Secondary | ICD-10-CM | POA: Diagnosis not present

## 2024-07-10 DIAGNOSIS — I10 Essential (primary) hypertension: Secondary | ICD-10-CM | POA: Diagnosis not present

## 2024-07-10 DIAGNOSIS — S299XXA Unspecified injury of thorax, initial encounter: Secondary | ICD-10-CM | POA: Diagnosis not present

## 2024-07-10 DIAGNOSIS — M25512 Pain in left shoulder: Secondary | ICD-10-CM | POA: Diagnosis not present

## 2024-07-10 DIAGNOSIS — M4802 Spinal stenosis, cervical region: Secondary | ICD-10-CM | POA: Diagnosis not present

## 2024-07-10 DIAGNOSIS — R0989 Other specified symptoms and signs involving the circulatory and respiratory systems: Secondary | ICD-10-CM | POA: Diagnosis not present

## 2024-07-10 DIAGNOSIS — S0003XA Contusion of scalp, initial encounter: Secondary | ICD-10-CM | POA: Diagnosis not present

## 2024-07-10 DIAGNOSIS — M47812 Spondylosis without myelopathy or radiculopathy, cervical region: Secondary | ICD-10-CM | POA: Diagnosis not present

## 2024-07-10 LAB — COMPREHENSIVE METABOLIC PANEL WITH GFR
ALT: 19 U/L (ref 0–44)
AST: 25 U/L (ref 15–41)
Albumin: 3.5 g/dL (ref 3.5–5.0)
Alkaline Phosphatase: 78 U/L (ref 38–126)
Anion gap: 14 (ref 5–15)
BUN: 37 mg/dL — ABNORMAL HIGH (ref 8–23)
CO2: 20 mmol/L — ABNORMAL LOW (ref 22–32)
Calcium: 9.1 mg/dL (ref 8.9–10.3)
Chloride: 105 mmol/L (ref 98–111)
Creatinine, Ser: 1.93 mg/dL — ABNORMAL HIGH (ref 0.44–1.00)
GFR, Estimated: 24 mL/min — ABNORMAL LOW (ref >60)
Glucose, Bld: 118 mg/dL — ABNORMAL HIGH (ref 70–99)
Potassium: 4 mmol/L (ref 3.5–5.1)
Sodium: 139 mmol/L (ref 135–145)
Total Bilirubin: 0.8 mg/dL (ref 0.0–1.2)
Total Protein: 6.7 g/dL (ref 6.5–8.1)

## 2024-07-10 LAB — I-STAT CHEM 8, ED
BUN: 42 mg/dL — ABNORMAL HIGH (ref 8–23)
Calcium, Ion: 1.12 mmol/L — ABNORMAL LOW (ref 1.15–1.40)
Chloride: 106 mmol/L (ref 98–111)
Creatinine, Ser: 2 mg/dL — ABNORMAL HIGH (ref 0.44–1.00)
Glucose, Bld: 117 mg/dL — ABNORMAL HIGH (ref 70–99)
HCT: 42 % (ref 36.0–46.0)
Hemoglobin: 14.3 g/dL (ref 12.0–15.0)
Potassium: 4 mmol/L (ref 3.5–5.1)
Sodium: 139 mmol/L (ref 135–145)
TCO2: 22 mmol/L (ref 22–32)

## 2024-07-10 LAB — CBC
HCT: 40.9 % (ref 36.0–46.0)
Hemoglobin: 13.4 g/dL (ref 12.0–15.0)
MCH: 30.5 pg (ref 26.0–34.0)
MCHC: 32.8 g/dL (ref 30.0–36.0)
MCV: 93.2 fL (ref 80.0–100.0)
Platelets: 211 K/uL (ref 150–400)
RBC: 4.39 MIL/uL (ref 3.87–5.11)
RDW: 14 % (ref 11.5–15.5)
WBC: 6.7 K/uL (ref 4.0–10.5)
nRBC: 0 % (ref 0.0–0.2)

## 2024-07-10 LAB — I-STAT CG4 LACTIC ACID, ED: Lactic Acid, Venous: 0.9 mmol/L (ref 0.5–1.9)

## 2024-07-10 NOTE — Progress Notes (Signed)
 Responded to page to support patient and staff. Pt. Experienced a fall. 2 visitors at bedside.  Chaplain provided emotional and spiritual support. Chaplain available as needed.  Rayleen Dade, [place], [facility], Pager [phone]

## 2024-07-10 NOTE — ED Notes (Incomplete)
 Trauma Response Nurse Documentation   Zylah Elsbernd is a 89 y.o. female arriving to [facility] ED via [facility] EMS  On Xarelto  (rivaroxaban ) daily. Trauma was activated as a Level 2 by charge RN based on the following trauma criteria Elderly patients > 65 with head trauma on anti-coagulation (excluding ASA).  Patient cleared for CT by Dr. Doretha. Pt transported to CT with Primary nurse present to monitor. RN remained with the patient throughout their absence from the department for clinical observation.   GCS 15.  History   Past Medical History:  Diagnosis Date   Hypertension    Pacemaker      Past Surgical History:  Procedure Laterality Date   ABDOMINAL HYSTERECTOMY     APPENDECTOMY     BREAST LUMPECTOMY     CHOLECYSTECTOMY         Initial Focused Assessment (If applicable, or please see trauma documentation): Airway - Clear Breathing -Unlabored Circulation - large hematoma to left side of head GCS - 15 - per EMS pt has some confusion, was able to answer everything appropriately on arrival   CT's Completed:   CT Head and CT C-Spine   Interventions:  Labs Xrays CT scans  Plan for disposition:  {Trauma Dispo:26867}   Consults completed:  {Trauma Consults:26862} at ***.  Event Summary:  Pt lk  MTP Summary (If applicable):   Bedside handoff with {Trauma handoff:26863::ED RN} ***.    Darice HERO Reyansh Kushnir  Trauma Response RN  Please call TRN at [phone] for further assistance.

## 2024-07-10 NOTE — Progress Notes (Signed)
 Orthopedic Tech Progress Note Patient Details:  Kera Deacon 19-Jun-1927 [id number] Ortho tech not needed. Level 2 Trauma Patient ID: Nyeli Holtmeyer, female   DOB: 1927-07-26, 89 y.o.   MRN: [id number]  Efrain DELENA Cos 07/10/2024, 9:27 AM

## 2024-07-10 NOTE — ED Provider Notes (Incomplete)
 Patient is an elderly female who lives at home independently and being brought in today by ambulance after a fall when she was trying to get off the toilet.  She fell on her left side hitting the left side of her head and landing on her left arm.  She is unaware if she lost consciousness or not.  EMS reports she was able to walk out to the stretcher without any difficulty with walking.  Patient is hard of hearing but is otherwise awake and alert.  Significant pain in the humerus and shoulder with pain when attempting to abduct her left arm.  Pulse and sensation are intact.  No pain in her lower extremities.  Large hematoma to the left side of the forehead.  Patient is on Xarelto  and currently mental status is at baseline. Patient's sons arrived and reports she is at her baseline.  Imaging negative for intracranial bleed or cervical injury.  Arm without evidence of acute fracture.  Labs are overall reassuring except for creatinine of 1.93 today which is increased from a year ago when it was 1.49.  Will need patient to follow-up with her PCP for monitoring.

## 2024-07-10 NOTE — ED Notes (Signed)
 Pt has obvious bruising and swelling to left forehead from fall

## 2024-07-10 NOTE — ED Notes (Addendum)
 X-Ray at bedside.

## 2024-07-10 NOTE — ED Triage Notes (Addendum)
 Pt BIB EMS after falling while trying to get off the toilet, hit her head, unsure on LOC. C/o headache and left arm pain. Pt on Xarelto . Dementia at baseline. Ambulatory on scene. GCS 15.

## 2024-07-10 NOTE — ED Notes (Signed)
 Phleb at bedside

## 2024-07-10 NOTE — ED Notes (Signed)
 Pt c collar removed and towel roll applied per MD

## 2024-07-10 NOTE — ED Notes (Signed)
 Pt has pacemaker per chest x ray

## 2024-07-10 NOTE — Discharge Instructions (Addendum)
 Penny Sanders:  Thank you for allowing us  to take care of you today.  We hope you begin feeling better soon. You were seen today for ground-level fall.  While you are here we performed lab work as well as CT (of your head, cervical spine) as well as x-ray imaging.  There is no emergent cause of your symptoms today, specifically no evidence of head bleed, fracture or broken bones, electrolyte abnormalities.  To-Do:  Please follow-up with your primary doctor within the next 2-3 days. It is important that you review any labs or imaging results (if any) that you had today with them. Your preliminary imaging results (if any) are attached. Please return to the Emergency Department or call 911 if you experience chest pain, shortness of breath, severe pain, severe fever, altered mental status, or have any reason to think that you need emergency medical care.  Thank you again.  Hope you feel better soon.  Department of Emergency Medicine

## 2024-07-10 NOTE — ED Provider Notes (Signed)
 [facility] Provider Note  MDM   HPI/ROS:  Penny Sanders is a 89 y.o. female with PMH of sick sinus syndrome, third-degree AV block s/p DC PPM, persistent atrial fibrillation on Xarelto  HTN who presents for a mechanical ground-level fall.  Patient was BIBEMS and activated as a level 2 trauma due to a fall on blood thinners.  She states that she was falling while trying to get off of the toilet, forgot her walker and had difficulty grabbing in the sink, stated that she hit her head and is unsure about LOC.  She complains of a headache and left arm pain.  Patient is at her mental baseline per sons at bedside and was ambulatory on the scene.  She denies any prodromal symptoms prior to falling including chest fluttering, palpitations, dizziness, lightheadedness.  DDx includes but is not limited to, intracranial bleed, electrolyte abnormality, infection, fractures  On my initial evaluation, patient is:  -Vital signs stable. Patient afebrile, hemodynamically stable, and non-toxic appearing. -Additional history obtained from sons at bedside who states that patient is post to use a walker, but consistently forgets it and uses furniture to brace herself when she walks.  Physical exam is notable for: - Goose-egg and ecchymosis over left forehead, 3 mm and equal and reactive pupils bilaterally, left mid arm pain with palpation, full ROM. This patient's current presentation, including their history and physical.  -- Abdomen is soft, nontender, nondistended, no obvious traumatic injuries, chest wall stable, pelvis was stable. -- Patient with some left-sided paraspinal C-spine tenderness, no midline tenderness, initially in a collar on arrival  CT head, CT C-spine both without any acute abnormalities.  CXR, left humerus and left shoulder imaging without any acute fractures, no pneumothorax, or evidence of pneumonia or infection.  Cervical collar cleared at  bedside. CMP with creatinine of 1.93 (1 year ago 1.45), CBC without medically relevant abnormalities, lactic acid 0.9.  Patient denies any urinary symptoms, this in conjunction with no signs or evidence of infection, low likelihood of fall being secondary to metabolic derangements or infection. Discussed patient's elevation in creatinine with sons at bedside with plan for PCP follow-up.  Interpretations, interventions, and the patient's course of care are documented below.         Disposition:  I discussed the plan for discharge with the patient and/or their surrogate at bedside prior to discharge and they were in agreement with the plan and verbalized understanding of the return precautions provided. All questions answered to the best of my ability. Ultimately, the patient was discharged in stable condition with stable vital signs. I am reassured that they are capable of close follow up and good social support at home.   Clinical Impression:  1. Ground-level fall     Rx / DC Orders ED Discharge Orders     None       The plan for this patient was discussed with Dr. Doretha, who voiced agreement and who oversaw evaluation and treatment of this patient.   Clinical Complexity A medically appropriate history, review of systems, and physical exam was performed.  My independent interpretations of EKG, labs, and radiology are documented in the ED course above.   If decision rules were used in this patient's evaluation, they are listed below.   Click here for ABCD2, HEART and other calculatorsREFRESH Note before signing   Patient's presentation is most consistent with acute complicated illness / injury requiring diagnostic workup.  Medical Decision Making Amount and/or  Complexity of Data Reviewed Labs: ordered. Radiology: ordered.    HPI/ROS      See MDM section for pertinent HPI and ROS. A complete ROS was performed with pertinent positives/negatives noted above.   Past  Medical History:  Diagnosis Date   Hypertension    Pacemaker     Past Surgical History:  Procedure Laterality Date   ABDOMINAL HYSTERECTOMY     APPENDECTOMY     BREAST LUMPECTOMY     CHOLECYSTECTOMY        Physical Exam   Vitals:   07/10/24 0922 07/10/24 0923 07/10/24 0924 07/10/24 0926  BP:  130/70    Pulse:    73  Resp:    18  Temp:   97.8 F (36.6 C)   TempSrc:   Axillary   SpO2:    99%  Height: 5' 1 (1.549 m)       Physical Exam Vitals and nursing note reviewed.  Constitutional:      General: She is not in acute distress.    Appearance: She is well-developed.  HENT:     Head:     Comments: Left forehead goose egg with overlying ecchymosis, no active bleeding, pupils 3 mm and equal and reactive bilaterally Eyes:     Conjunctiva/sclera: Conjunctivae normal.  Cardiovascular:     Rate and Rhythm: Normal rate and regular rhythm.     Heart sounds: No murmur heard. Pulmonary:     Effort: Pulmonary effort is normal. No respiratory distress.     Breath sounds: Normal breath sounds.  Abdominal:     Palpations: Abdomen is soft.     Tenderness: There is no abdominal tenderness.  Musculoskeletal:        General: No swelling.     Cervical back: Neck supple.  Skin:    General: Skin is warm and dry.     Capillary Refill: Capillary refill takes less than 2 seconds.  Neurological:     General: No focal deficit present.     Mental Status: She is alert. Mental status is at baseline.  Psychiatric:        Mood and Affect: Mood normal.      Procedures   If procedures were preformed on this patient, they are listed below:  Procedures   S. Hephzibah Strehle, DO Electronically signed on 07/10/2024 11:30 AM  Please note that this documentation was produced with the assistance of voice-to-text technology and may contain errors.     Billy Pal, MD 07/10/24 1131    Doretha Folks, MD 07/11/24 [phone]

## 2024-07-11 DIAGNOSIS — W1830XA Fall on same level, unspecified, initial encounter: Secondary | ICD-10-CM | POA: Diagnosis not present

## 2024-07-11 DIAGNOSIS — M79622 Pain in left upper arm: Secondary | ICD-10-CM | POA: Diagnosis not present

## 2024-07-18 DIAGNOSIS — N390 Urinary tract infection, site not specified: Secondary | ICD-10-CM | POA: Diagnosis not present

## 2024-07-24 ENCOUNTER — Ambulatory Visit (INDEPENDENT_AMBULATORY_CARE_PROVIDER_SITE_OTHER): Payer: Medicare Other

## 2024-07-24 DIAGNOSIS — I4821 Permanent atrial fibrillation: Secondary | ICD-10-CM

## 2024-07-24 LAB — CUP PACEART REMOTE DEVICE CHECK
Date Time Interrogation Session: 20251106091113
Implantable Lead Connection Status: 753985
Implantable Lead Connection Status: 753985
Implantable Lead Implant Date: 20130903
Implantable Lead Implant Date: 20130903
Implantable Lead Location: 753859
Implantable Lead Location: 753860
Implantable Lead Model: 4456
Implantable Lead Model: 5076
Implantable Lead Serial Number: 1111
Implantable Pulse Generator Implant Date: 20130903
Pulse Gen Serial Number: 66295010

## 2024-07-25 NOTE — Progress Notes (Signed)
 Remote PPM Transmission

## 2024-07-29 ENCOUNTER — Ambulatory Visit: Payer: Self-pay | Admitting: Cardiology

## 2024-07-29 DIAGNOSIS — Z111 Encounter for screening for respiratory tuberculosis: Secondary | ICD-10-CM | POA: Diagnosis not present

## 2024-08-01 DIAGNOSIS — N3020 Other chronic cystitis without hematuria: Secondary | ICD-10-CM | POA: Diagnosis not present

## 2024-08-01 DIAGNOSIS — R351 Nocturia: Secondary | ICD-10-CM | POA: Diagnosis not present

## 2024-08-05 ENCOUNTER — Telehealth: Payer: Self-pay

## 2024-08-05 NOTE — Telephone Encounter (Signed)
 Pt daughter in law called in letting us  know pt is moving into a assisted living and they will unplug the monitor and bring it with her. They would like a call back after Monday 08/11/2024 to make sure we have connection with it

## 2024-08-12 DIAGNOSIS — Z7901 Long term (current) use of anticoagulants: Secondary | ICD-10-CM | POA: Diagnosis not present

## 2024-08-12 DIAGNOSIS — I10 Essential (primary) hypertension: Secondary | ICD-10-CM | POA: Diagnosis not present

## 2024-08-12 DIAGNOSIS — I4891 Unspecified atrial fibrillation: Secondary | ICD-10-CM | POA: Diagnosis not present

## 2024-08-12 DIAGNOSIS — F0390 Unspecified dementia without behavioral disturbance: Secondary | ICD-10-CM | POA: Diagnosis not present

## 2024-08-18 DIAGNOSIS — F02A0 Dementia in other diseases classified elsewhere, mild, without behavioral disturbance, psychotic disturbance, mood disturbance, and anxiety: Secondary | ICD-10-CM | POA: Diagnosis not present

## 2024-09-09 ENCOUNTER — Ambulatory Visit: Admitting: Pulmonary Disease

## 2024-10-07 ENCOUNTER — Encounter: Payer: Self-pay | Admitting: Pulmonary Disease

## 2024-10-07 ENCOUNTER — Ambulatory Visit: Admitting: Cardiology

## 2024-10-07 ENCOUNTER — Ambulatory Visit: Payer: Self-pay | Admitting: Cardiology

## 2024-10-07 VITALS — BP 104/66 | HR 71 | Ht 61.0 in

## 2024-10-07 DIAGNOSIS — I442 Atrioventricular block, complete: Secondary | ICD-10-CM | POA: Diagnosis present

## 2024-10-07 DIAGNOSIS — I4821 Permanent atrial fibrillation: Secondary | ICD-10-CM | POA: Diagnosis present

## 2024-10-07 LAB — CUP PACEART INCLINIC DEVICE CHECK
Battery Remaining Percentage: 20 %
Brady Statistic RV Percent Paced: 100 %
Date Time Interrogation Session: 20260120110256
Implantable Lead Connection Status: 753985
Implantable Lead Connection Status: 753985
Implantable Lead Implant Date: 20130903
Implantable Lead Implant Date: 20130903
Implantable Lead Location: 753859
Implantable Lead Location: 753860
Implantable Lead Model: 4456
Implantable Lead Model: 5076
Implantable Lead Serial Number: 1111
Implantable Pulse Generator Implant Date: 20130903
Lead Channel Impedance Value: 312 Ohm
Lead Channel Impedance Value: 546 Ohm
Lead Channel Pacing Threshold Amplitude: 0.8 V
Lead Channel Pacing Threshold Pulse Width: 0.4 ms
Lead Channel Setting Pacing Amplitude: 2.5 V
Lead Channel Setting Pacing Pulse Width: 0.4 ms
Lead Channel Setting Sensing Sensitivity: 2.5 mV
Pulse Gen Serial Number: 66295010

## 2024-10-07 NOTE — Progress Notes (Signed)
" °  Electrophysiology Office Note:   ID:  Penny Sanders, DOB 23-Nov-1926, MRN [id number]  Primary Cardiologist: None Electrophysiologist: Will Gladis Norton, MD      History of Present Illness:   Penny Sanders is a 89 y.o. female with h/o atrial fibrillation, sick sinus syndrome with third-degree AV block seen today for routine electrophysiology followup. She presents with her son today.   Patient with some baseline confusion and is very hard of hearing, limiting HPI. No cardiac complaints today. Reports chronic knee pain. Per son, patient transitioned from living independently at home to an assisted living facility about 3 months ago. No focal cardiac complaints today, she denies chest pain, palpitations, dyspnea, PND.  Review of systems complete and found to be negative unless listed in HPI.   EP Information / Studies Reviewed:    EKG is ordered today. Personal review as below.  EKG Interpretation Date/Time:  Tuesday October 07 2024 10:55:03 EST Ventricular Rate:  71 PR Interval:    QRS Duration:  154 QT Interval:  460 QTC Calculation: 499 R Axis:   261  Text Interpretation: atrial fibrillation with Ventricular-paced rhythm Confirmed by Trudy Birmingham [phone]) on 10/07/2024 10:56:50 AM    PPM Interrogation-  reviewed in detail today,  See PACEART report.  Arrhythmia/Device History PPM- Biotronik   Physical Exam:   VS:  There were no vitals taken for this visit.   Wt Readings from Last 3 Encounters:  07/10/24 150 lb (68 kg)  08/06/23 150 lb 3.2 oz (68.1 kg)  07/11/23 141 lb (64 kg)     GEN: No acute distress  NECK: No JVD CARDIAC: Irregularly irregular rate and rhythm. Faint systolic murmur over RUSB RESPIRATORY:  Clear to auscultation without rales, wheezing or rhonchi  ABDOMEN: Soft, non-tender, non-distended EXTREMITIES:  No edema; No deformity   ASSESSMENT AND PLAN:    CHB s/p Biotronik PPM  Normal PPM function. 100% V paced. No R waves at VVI 40. Estimated battery  remaining 74yr 95m. Will ensure clinic follow up in 1 year. See Elisabeth Art report No changes today  Permanent atrial fibrillation Secondary hypercoagulable state Afib on clinic ECG today. PPM programmed VVI. Continue Xarelto  15mg . October 2025 labs reviewed.      Disposition:   Follow up with EP Team in 12 months  Signed, [place] Trudy, PA-C  "

## 2024-10-07 NOTE — Patient Instructions (Signed)
 Medication Instructions:  None  *If you need a refill on your cardiac medications before your next appointment, please call your pharmacy*  Lab Work: None  If you have labs (blood work) drawn today and your tests are completely normal, you will receive your results only by: MyChart Message (if you have MyChart) OR A paper copy in the mail If you have any lab test that is abnormal or we need to change your treatment, we will call you to review the results.  Testing/Procedures: None   Follow-Up: At [facility], you and your health needs are our priority.  As part of our continuing mission to provide you with exceptional heart care, our providers are all part of one team.  This team includes your primary Cardiologist (physician) and Advanced Practice Providers or APPs (Physician Assistants and Nurse Practitioners) who all work together to provide you with the care you need, when you need it.  Your next appointment:   1 year(s)  Provider:   Soyla Norton, MD    We recommend signing up for the patient portal called MyChart.  Sign up information is provided on this After Visit Summary.  MyChart is used to connect with patients for Virtual Visits (Telemedicine).  Patients are able to view lab/test results, encounter notes, upcoming appointments, etc.  Non-urgent messages can be sent to your provider as well.   To learn more about what you can do with MyChart, go to [email or web address].   Other Instructions None

## 2024-10-23 ENCOUNTER — Ambulatory Visit

## 2024-10-23 LAB — CUP PACEART REMOTE DEVICE CHECK
Battery Voltage: 15
Date Time Interrogation Session: 20260205081545
Implantable Lead Connection Status: 753985
Implantable Lead Connection Status: 753985
Implantable Lead Implant Date: 20130903
Implantable Lead Implant Date: 20130903
Implantable Lead Location: 753859
Implantable Lead Location: 753860
Implantable Lead Model: 4456
Implantable Lead Model: 5076
Implantable Lead Serial Number: 1111
Implantable Pulse Generator Implant Date: 20130903
Pulse Gen Serial Number: 66295010

## 2025-01-22 ENCOUNTER — Ambulatory Visit

## 2025-04-23 ENCOUNTER — Ambulatory Visit

## 2025-07-23 ENCOUNTER — Ambulatory Visit
